# Patient Record
Sex: Female | Born: 1945 | Race: Black or African American | Hispanic: No | Marital: Married | Smoking: Never smoker
Health system: Southern US, Community
[De-identification: ages and names within clinical notes are randomized; demographics above are authoritative.]

## PROBLEM LIST (undated history)

## (undated) DIAGNOSIS — R7303 Prediabetes: Secondary | ICD-10-CM

## (undated) DIAGNOSIS — I10 Essential (primary) hypertension: Secondary | ICD-10-CM

## (undated) DIAGNOSIS — M1990 Unspecified osteoarthritis, unspecified site: Secondary | ICD-10-CM

## (undated) DIAGNOSIS — Z9889 Other specified postprocedural states: Secondary | ICD-10-CM

## (undated) DIAGNOSIS — E782 Mixed hyperlipidemia: Secondary | ICD-10-CM

## (undated) DIAGNOSIS — R002 Palpitations: Secondary | ICD-10-CM

## (undated) DIAGNOSIS — I341 Nonrheumatic mitral (valve) prolapse: Secondary | ICD-10-CM

## (undated) DIAGNOSIS — N189 Chronic kidney disease, unspecified: Secondary | ICD-10-CM

## (undated) DIAGNOSIS — E119 Type 2 diabetes mellitus without complications: Secondary | ICD-10-CM

## (undated) HISTORY — PX: BREAST EXCISIONAL BIOPSY: SUR124

## (undated) HISTORY — DX: Prediabetes: R73.03

## (undated) HISTORY — DX: Palpitations: R00.2

## (undated) HISTORY — DX: Mixed hyperlipidemia: E78.2

## (undated) HISTORY — DX: Essential (primary) hypertension: I10

## (undated) HISTORY — PX: REPLACEMENT TOTAL KNEE: SUR1224

## (undated) HISTORY — DX: Nonrheumatic mitral (valve) prolapse: I34.1

## (undated) HISTORY — PX: CATARACT EXTRACTION PHACO AND INTRAOCULAR LENS PLACEMENT W/ CORTICOSTEROID: SHX7020

---

## 2004-06-07 ENCOUNTER — Ambulatory Visit: Payer: Self-pay | Admitting: Occupational Therapy

## 2005-06-30 ENCOUNTER — Emergency Department (HOSPITAL_COMMUNITY): Admission: EM | Admit: 2005-06-30 | Discharge: 2005-06-30 | Payer: Self-pay | Admitting: Emergency Medicine

## 2007-03-26 ENCOUNTER — Encounter: Admission: RE | Admit: 2007-03-26 | Discharge: 2007-03-26 | Payer: Self-pay | Admitting: Nurse Practitioner

## 2007-04-16 ENCOUNTER — Ambulatory Visit (HOSPITAL_COMMUNITY): Admission: RE | Admit: 2007-04-16 | Discharge: 2007-04-16 | Payer: Self-pay | Admitting: Internal Medicine

## 2007-04-16 ENCOUNTER — Ambulatory Visit: Payer: Self-pay | Admitting: Internal Medicine

## 2007-04-16 ENCOUNTER — Encounter: Payer: Self-pay | Admitting: Internal Medicine

## 2008-02-19 ENCOUNTER — Ambulatory Visit: Payer: Self-pay | Admitting: Internal Medicine

## 2008-02-19 DIAGNOSIS — E119 Type 2 diabetes mellitus without complications: Secondary | ICD-10-CM

## 2008-02-19 DIAGNOSIS — I10 Essential (primary) hypertension: Secondary | ICD-10-CM | POA: Insufficient documentation

## 2008-02-19 DIAGNOSIS — E785 Hyperlipidemia, unspecified: Secondary | ICD-10-CM | POA: Insufficient documentation

## 2008-02-19 DIAGNOSIS — J309 Allergic rhinitis, unspecified: Secondary | ICD-10-CM | POA: Insufficient documentation

## 2008-04-08 ENCOUNTER — Encounter (INDEPENDENT_AMBULATORY_CARE_PROVIDER_SITE_OTHER): Payer: Self-pay | Admitting: Internal Medicine

## 2008-04-24 ENCOUNTER — Telehealth (INDEPENDENT_AMBULATORY_CARE_PROVIDER_SITE_OTHER): Payer: Self-pay | Admitting: Internal Medicine

## 2008-05-19 ENCOUNTER — Ambulatory Visit: Payer: Self-pay | Admitting: Internal Medicine

## 2008-05-19 DIAGNOSIS — R42 Dizziness and giddiness: Secondary | ICD-10-CM

## 2008-05-19 DIAGNOSIS — M549 Dorsalgia, unspecified: Secondary | ICD-10-CM | POA: Insufficient documentation

## 2008-05-19 LAB — CONVERTED CEMR LAB: Blood Glucose, Fingerstick: 78

## 2008-05-23 ENCOUNTER — Encounter: Admission: RE | Admit: 2008-05-23 | Discharge: 2008-05-23 | Payer: Self-pay | Admitting: Internal Medicine

## 2008-06-10 ENCOUNTER — Encounter (INDEPENDENT_AMBULATORY_CARE_PROVIDER_SITE_OTHER): Payer: Self-pay | Admitting: Internal Medicine

## 2008-06-11 LAB — CONVERTED CEMR LAB
Alkaline Phosphatase: 56 units/L (ref 39–117)
BUN: 23 mg/dL (ref 6–23)
Calcium: 9.5 mg/dL (ref 8.4–10.5)
Cholesterol: 172 mg/dL (ref 0–200)
Creatinine, Ser: 1.19 mg/dL (ref 0.40–1.20)
Potassium: 4 meq/L (ref 3.5–5.3)
Total CHOL/HDL Ratio: 2.5

## 2008-08-08 ENCOUNTER — Ambulatory Visit: Payer: Self-pay | Admitting: Internal Medicine

## 2008-08-08 DIAGNOSIS — N390 Urinary tract infection, site not specified: Secondary | ICD-10-CM | POA: Insufficient documentation

## 2008-08-08 LAB — CONVERTED CEMR LAB
Bilirubin Urine: NEGATIVE
Ketones, urine, test strip: NEGATIVE
Urobilinogen, UA: 0.2

## 2009-06-02 ENCOUNTER — Encounter: Admission: RE | Admit: 2009-06-02 | Discharge: 2009-06-02 | Payer: Self-pay | Admitting: Family Medicine

## 2010-06-02 ENCOUNTER — Other Ambulatory Visit: Payer: Self-pay | Admitting: Family Medicine

## 2010-06-02 DIAGNOSIS — Z1231 Encounter for screening mammogram for malignant neoplasm of breast: Secondary | ICD-10-CM

## 2010-06-09 ENCOUNTER — Ambulatory Visit: Payer: Self-pay

## 2010-06-29 ENCOUNTER — Ambulatory Visit
Admission: RE | Admit: 2010-06-29 | Discharge: 2010-06-29 | Disposition: A | Discharge status: Home / Self Care | Payer: BLUE CROSS/BLUE SHIELD | Source: Ambulatory Visit | Attending: *Deleted | Admitting: *Deleted

## 2010-06-29 DIAGNOSIS — Z1231 Encounter for screening mammogram for malignant neoplasm of breast: Secondary | ICD-10-CM

## 2010-07-27 NOTE — Op Note (Signed)
Terri Miranda, Terri Miranda              ACCOUNT NO.:  [id number]   MEDICAL RECORD NO.:  [id number]          PATIENT TYPE:  AMB   LOCATION:  DAY                           FACILITY:  [facility]   PHYSICIAN:  R. Roetta Sessions, M.D. DATE OF BIRTH:  [birth year]   DATE OF PROCEDURE:  04/16/2007  DATE OF DISCHARGE:                               OPERATIVE REPORT   PROCEDURE:  Colonoscopy with biopsy.   INDICATIONS FOR PROCEDURE:  A 65 year old lady without any lower GI  tract symptoms, sent over by courtesy of Ninfa Linden, FNP in  [place], [place] for colorectal cancer screening.  She states she had a  colonoscopy some 10 years in [place], and one polyp was found.  There  is no family history of colorectal neoplasia or colonic adenomas.  Colonoscopy is now being done as screening maneuver.  This approach has  discussed with the patient at length.  The potential risks, benefits and  alternatives have been reviewed and questions answered.  She is  agreeable.  Please see the documentation in the medical record.   PROCEDURE NOTE:  O2 saturation, blood pressure, pulse and respiration  were monitored throughout the entire procedure.  Conscious sedation was  with Versed 5 mg IV and Demerol 100 mg IV in divided doses.  Instrument  used was the Pentax video chip system.   FINDINGS:  Digital rectal exam revealed no abnormalities.  The prep was  adequate.   COLON:  The mucosa was surveyed from the rectosigmoid junction through  to the left transverse right colon, then onto the appendiceal orifice,  ileocecal valve and cecum; these structures were well seen and  photographed for the record.  From this level the scope was colon slowly  and cautiously withdrawn.  All previous mentioned mucosal surfaces were  again seen.  The patient had multiple diminutive polyps throughout the  colon.  There were 2 small, adenomatous-appearing polyps in the base of  the cecum -- which were cold biopsied and removed.  In the  mid  descending colon there were a couple more diminutive polyps which were  cold biopsied and removed.  Down in the rectosigmoid and rectum there  were more diminutive polyps, one in each area and no more than 4 mm in  diameter each.  They were all cold biopsied.  The patient also had  scattered sigmoid diverticula.  As the scope was pulled down into the  rectum, retroflexion was performed and this is when the single  diminutive rectal polyp was seen at approximately 6 cm.  As stated, all  of these polyps were cold biopsied and removed.   The patient tolerated the procedure well and was reacted in endoscopy.   IMPRESSION:  Diminutive rectal polyp in the rectosigmoid, descending  colon and cecal polyps -- all cold biopsied and removed.  There was  sigmoid diverticula and colonic mucosa appeared normal.   RECOMMENDATIONS:  1. Follow up on pathology.  2. Further recommendations to follow.      Jonathon Bellows, M.D.  Electronically Signed     RMR/MEDQ  D:  04/16/2007  T:  04/16/2007  Job:  [id number]

## 2011-06-08 ENCOUNTER — Other Ambulatory Visit: Payer: Self-pay | Admitting: Internal Medicine

## 2011-06-08 DIAGNOSIS — Z1231 Encounter for screening mammogram for malignant neoplasm of breast: Secondary | ICD-10-CM

## 2011-07-07 ENCOUNTER — Ambulatory Visit
Admission: RE | Admit: 2011-07-07 | Discharge: 2011-07-07 | Disposition: A | Discharge status: Home / Self Care | Payer: BC Managed Care – PPO | Source: Ambulatory Visit | Attending: Internal Medicine | Admitting: Internal Medicine

## 2011-07-07 DIAGNOSIS — Z1231 Encounter for screening mammogram for malignant neoplasm of breast: Secondary | ICD-10-CM

## 2011-12-14 ENCOUNTER — Encounter (HOSPITAL_COMMUNITY): Payer: Self-pay | Admitting: *Deleted

## 2011-12-14 ENCOUNTER — Emergency Department (HOSPITAL_COMMUNITY)
Admission: EM | Admit: 2011-12-14 | Discharge: 2011-12-14 | Disposition: A | Discharge status: Home / Self Care | Payer: Medicare Other | Attending: Emergency Medicine | Admitting: Emergency Medicine

## 2011-12-14 ENCOUNTER — Emergency Department (HOSPITAL_COMMUNITY): Payer: Medicare Other

## 2011-12-14 DIAGNOSIS — Z888 Allergy status to other drugs, medicaments and biological substances status: Secondary | ICD-10-CM | POA: Insufficient documentation

## 2011-12-14 DIAGNOSIS — Z7982 Long term (current) use of aspirin: Secondary | ICD-10-CM | POA: Insufficient documentation

## 2011-12-14 DIAGNOSIS — I809 Phlebitis and thrombophlebitis of unspecified site: Secondary | ICD-10-CM

## 2011-12-14 DIAGNOSIS — Z79899 Other long term (current) drug therapy: Secondary | ICD-10-CM | POA: Insufficient documentation

## 2011-12-14 DIAGNOSIS — I803 Phlebitis and thrombophlebitis of lower extremities, unspecified: Secondary | ICD-10-CM | POA: Insufficient documentation

## 2011-12-14 DIAGNOSIS — Z91013 Allergy to seafood: Secondary | ICD-10-CM | POA: Insufficient documentation

## 2011-12-14 DIAGNOSIS — I10 Essential (primary) hypertension: Secondary | ICD-10-CM | POA: Insufficient documentation

## 2011-12-14 MED ORDER — IBUPROFEN 600 MG PO TABS: 600.0000 mg | ORAL_TABLET | Freq: Four times a day (QID) | ORAL | Status: DC | PRN

## 2011-12-14 NOTE — ED Notes (Signed)
Pt went to medical clinic today and was sent here for swelling to left lower leg to r/o DVT

## 2011-12-14 NOTE — ED Provider Notes (Signed)
History     CSN: [id number]  Arrival date & time 12/14/11  1610   First MD Initiated Contact with Patient 12/14/11 1652      Chief Complaint  Patient presents with  . Leg Swelling    (Consider location/radiation/quality/duration/timing/severity/associated sxs/prior treatment) HPI Comments: Pt comes in with cc of leg swelling -left sided. She has had increased swelling for a week now, with some pain, described as tightness, and discomfort, mostly in the calf region, worse with walking.  There is no hx of dvt, pe, and she has no risk factors for the same. Pt has no n/v/f/c. No rash. No recent trauma.   The history is provided by the patient.    Past Medical History  Diagnosis Date  . Hypertension     History reviewed. No pertinent past surgical history.  No family history on file.  History  Substance Use Topics  . Smoking status: Never Smoker   . Smokeless tobacco: Not on file  . Alcohol Use: Yes     Occ wine    OB History    Grav Para Term Preterm Abortions TAB SAB Ect Mult Living                  Review of Systems  Constitutional: Positive for activity change.  HENT: Negative for neck pain.   Respiratory: Negative for shortness of breath.   Cardiovascular: Negative for chest pain.  Gastrointestinal: Negative for nausea, vomiting and abdominal pain.  Genitourinary: Negative for dysuria.  Neurological: Negative for headaches.    Allergies  Shellfish allergy and Lovastatin  Home Medications   Current Outpatient Rx  Name Route Sig Dispense Refill  . ASPIRIN EC 81 MG PO TBEC Oral Take 81 mg by mouth daily.    Marland Kitchen CALCIUM CARBONATE 600 MG PO TABS Oral Take 600 mg by mouth daily.    . ENALAPRIL MALEATE 5 MG PO TABS Oral Take 5 mg by mouth daily.    . OMEGA-3 FATTY ACIDS 1000 MG PO CAPS Oral Take 1 g by mouth daily.    Marland Kitchen HYDROCHLOROTHIAZIDE 12.5 MG PO CAPS Oral Take 12.5 mg by mouth daily.    . ADULT MULTIVITAMIN W/MINERALS CH Oral Take 1 tablet by mouth  daily.    Marland Kitchen OXYMETAZOLINE HCL 0.05 % NA SOLN Nasal Place 2 sprays into the nose daily as needed. For congestion/ relief    . PRAVASTATIN SODIUM 20 MG PO TABS Oral Take 20 mg by mouth every evening.      BP 135/69  Pulse 64  Temp 98.3 F (36.8 C) (Oral)  Resp 18  Ht 5' 7.5" (1.715 m)  Wt 207 lb (93.895 kg)  BMI 31.94 kg/m2  SpO2 99%  Physical Exam  Nursing note and vitals reviewed. Constitutional: She is oriented to person, place, and time. She appears well-developed and well-nourished.  HENT:  Head: Normocephalic and atraumatic.  Eyes: EOM are normal. Pupils are equal, round, and reactive to light.  Neck: Neck supple.  Cardiovascular: Normal rate, regular rhythm and normal heart sounds.   No murmur heard. Pulmonary/Chest: Effort normal. No respiratory distress.  Abdominal: Soft. She exhibits no distension. There is no tenderness. There is no rebound and no guarding.  Musculoskeletal:       LLE swelling - about 4 cm compared to the contralateral side. There is calf tenderness with palpation. Pt has some warmth to touch, but no true erythema noticed.  Neurological: She is alert and oriented to person, place, and time.  Skin: Skin is warm and dry.    ED Course  Procedures (including critical care time)  Labs Reviewed - No data to display US Venous Img Lower Unilateral Left  12/14/2011  *RADIOLOGY REPORT*  Clinical Data: 66 year old female with left lower extremity pain and swelling.  LEFT LOWER EXTREMITY VENOUS DUPLEX ULTRASOUND  Technique:  Gray-scale sonography with graded compression, as well as color Doppler and duplex ultrasound were performed to evaluate the deep venous system of the lower extremity from the level of the common femoral vein through the popliteal and proximal calf veins. Spectral Doppler was utilized to evaluate flow at rest and with distal augmentation maneuvers.  Comparison:  None.  Findings:  Normal compressibility of the common femoral, superficial  femoral, and popliteal veins is demonstrated, as well as the visualized proximal calf veins.  No filling defects to suggest DVT on grayscale or color Doppler imaging.  Doppler waveforms show normal direction of venous flow, normal respiratory phasicity and response to augmentation.  Thrombosis of a superficial vein in the posterior calf is identified.  IMPRESSION: No evidence of left lower extremity deep vein thrombosis.  Superficial venous thrombosis in the posterior calf.   Original Report Authenticated By: Rosendo Gros, M.D.      No diagnosis found.    MDM  DDx: DVT Cellulitis Osteomyelitis Superficial thrombophlebitis  Pt comes in with unilateral leg swelling, and my exam demonstrated 4 cm widening of the LLE, with calf tenderness and some warmth to touch. Concerns for DVT. Will get [place].  6:57 PM US duplex shows superficial thrombophlebitis. Will treat with nsaids, ice.        Derwood Kaplan, MD 12/14/11 1857

## 2011-12-14 NOTE — ED Notes (Signed)
Pt states she is not having any pain.

## 2011-12-14 NOTE — ED Notes (Signed)
Discharge instructions reviewed with pt, questions answered. Pt verbalized understanding.  

## 2011-12-14 NOTE — ED Notes (Signed)
PT to us

## 2012-02-21 ENCOUNTER — Other Ambulatory Visit: Payer: Self-pay | Admitting: Internal Medicine

## 2012-02-21 ENCOUNTER — Other Ambulatory Visit (HOSPITAL_COMMUNITY)
Admission: RE | Admit: 2012-02-21 | Discharge: 2012-02-21 | Disposition: A | Discharge status: Home / Self Care | Payer: Medicare Other | Source: Ambulatory Visit | Attending: Internal Medicine | Admitting: Internal Medicine

## 2012-02-21 DIAGNOSIS — Z1151 Encounter for screening for human papillomavirus (HPV): Secondary | ICD-10-CM | POA: Insufficient documentation

## 2012-02-21 DIAGNOSIS — Z01419 Encounter for gynecological examination (general) (routine) without abnormal findings: Secondary | ICD-10-CM | POA: Insufficient documentation

## 2012-06-12 DIAGNOSIS — E119 Type 2 diabetes mellitus without complications: Secondary | ICD-10-CM | POA: Diagnosis not present

## 2012-06-12 DIAGNOSIS — E785 Hyperlipidemia, unspecified: Secondary | ICD-10-CM | POA: Diagnosis not present

## 2012-06-12 DIAGNOSIS — K1120 Sialoadenitis, unspecified: Secondary | ICD-10-CM | POA: Diagnosis not present

## 2012-06-12 DIAGNOSIS — I10 Essential (primary) hypertension: Secondary | ICD-10-CM | POA: Diagnosis not present

## 2012-07-03 DIAGNOSIS — K1120 Sialoadenitis, unspecified: Secondary | ICD-10-CM | POA: Diagnosis not present

## 2012-07-23 ENCOUNTER — Other Ambulatory Visit: Payer: Self-pay

## 2012-07-23 DIAGNOSIS — Z1231 Encounter for screening mammogram for malignant neoplasm of breast: Secondary | ICD-10-CM

## 2012-08-16 ENCOUNTER — Ambulatory Visit
Admission: RE | Admit: 2012-08-16 | Discharge: 2012-08-16 | Disposition: A | Discharge status: Home / Self Care | Payer: Medicare Other | Source: Ambulatory Visit

## 2012-08-16 DIAGNOSIS — Z1231 Encounter for screening mammogram for malignant neoplasm of breast: Secondary | ICD-10-CM

## 2012-08-20 DIAGNOSIS — E119 Type 2 diabetes mellitus without complications: Secondary | ICD-10-CM | POA: Diagnosis not present

## 2012-08-20 DIAGNOSIS — H40029 Open angle with borderline findings, high risk, unspecified eye: Secondary | ICD-10-CM | POA: Diagnosis not present

## 2012-08-20 DIAGNOSIS — H04129 Dry eye syndrome of unspecified lacrimal gland: Secondary | ICD-10-CM | POA: Diagnosis not present

## 2012-08-20 DIAGNOSIS — H00029 Hordeolum internum unspecified eye, unspecified eyelid: Secondary | ICD-10-CM | POA: Diagnosis not present

## 2012-09-05 DIAGNOSIS — E119 Type 2 diabetes mellitus without complications: Secondary | ICD-10-CM | POA: Diagnosis not present

## 2012-09-05 DIAGNOSIS — I10 Essential (primary) hypertension: Secondary | ICD-10-CM | POA: Diagnosis not present

## 2012-09-05 DIAGNOSIS — M7989 Other specified soft tissue disorders: Secondary | ICD-10-CM | POA: Diagnosis not present

## 2012-09-05 DIAGNOSIS — E785 Hyperlipidemia, unspecified: Secondary | ICD-10-CM | POA: Diagnosis not present

## 2012-09-05 DIAGNOSIS — R300 Dysuria: Secondary | ICD-10-CM | POA: Diagnosis not present

## 2012-12-27 DIAGNOSIS — Z23 Encounter for immunization: Secondary | ICD-10-CM | POA: Diagnosis not present

## 2013-02-21 DIAGNOSIS — Z0000 Encounter for general adult medical examination without abnormal findings: Secondary | ICD-10-CM | POA: Diagnosis not present

## 2013-02-21 DIAGNOSIS — I10 Essential (primary) hypertension: Secondary | ICD-10-CM | POA: Diagnosis not present

## 2013-02-21 DIAGNOSIS — E785 Hyperlipidemia, unspecified: Secondary | ICD-10-CM | POA: Diagnosis not present

## 2013-02-21 DIAGNOSIS — R609 Edema, unspecified: Secondary | ICD-10-CM | POA: Diagnosis not present

## 2013-02-21 DIAGNOSIS — Z23 Encounter for immunization: Secondary | ICD-10-CM | POA: Diagnosis not present

## 2013-02-21 DIAGNOSIS — R0609 Other forms of dyspnea: Secondary | ICD-10-CM | POA: Diagnosis not present

## 2013-02-21 DIAGNOSIS — E119 Type 2 diabetes mellitus without complications: Secondary | ICD-10-CM | POA: Diagnosis not present

## 2013-02-21 DIAGNOSIS — N189 Chronic kidney disease, unspecified: Secondary | ICD-10-CM | POA: Diagnosis not present

## 2013-05-15 DIAGNOSIS — E785 Hyperlipidemia, unspecified: Secondary | ICD-10-CM | POA: Diagnosis not present

## 2013-05-15 DIAGNOSIS — E119 Type 2 diabetes mellitus without complications: Secondary | ICD-10-CM | POA: Diagnosis not present

## 2013-08-19 ENCOUNTER — Other Ambulatory Visit: Payer: Self-pay

## 2013-08-19 DIAGNOSIS — Z1231 Encounter for screening mammogram for malignant neoplasm of breast: Secondary | ICD-10-CM

## 2013-08-26 ENCOUNTER — Encounter (INDEPENDENT_AMBULATORY_CARE_PROVIDER_SITE_OTHER): Payer: Self-pay

## 2013-08-26 ENCOUNTER — Ambulatory Visit
Admission: RE | Admit: 2013-08-26 | Discharge: 2013-08-26 | Disposition: A | Discharge status: Home / Self Care | Payer: Medicare Other | Source: Ambulatory Visit

## 2013-08-26 DIAGNOSIS — Z1231 Encounter for screening mammogram for malignant neoplasm of breast: Secondary | ICD-10-CM

## 2013-08-26 DIAGNOSIS — I10 Essential (primary) hypertension: Secondary | ICD-10-CM | POA: Diagnosis not present

## 2013-08-26 DIAGNOSIS — E785 Hyperlipidemia, unspecified: Secondary | ICD-10-CM | POA: Diagnosis not present

## 2013-08-26 DIAGNOSIS — E119 Type 2 diabetes mellitus without complications: Secondary | ICD-10-CM | POA: Diagnosis not present

## 2013-08-26 DIAGNOSIS — R29898 Other symptoms and signs involving the musculoskeletal system: Secondary | ICD-10-CM | POA: Diagnosis not present

## 2013-08-26 DIAGNOSIS — Z1211 Encounter for screening for malignant neoplasm of colon: Secondary | ICD-10-CM | POA: Diagnosis not present

## 2013-09-17 ENCOUNTER — Ambulatory Visit: Payer: PRIVATE HEALTH INSURANCE | Admitting: Podiatry

## 2013-10-07 DIAGNOSIS — R300 Dysuria: Secondary | ICD-10-CM | POA: Diagnosis not present

## 2013-10-07 DIAGNOSIS — N390 Urinary tract infection, site not specified: Secondary | ICD-10-CM | POA: Diagnosis not present

## 2013-11-07 ENCOUNTER — Encounter (INDEPENDENT_AMBULATORY_CARE_PROVIDER_SITE_OTHER): Payer: Self-pay | Admitting: *Deleted

## 2013-11-19 ENCOUNTER — Encounter (INDEPENDENT_AMBULATORY_CARE_PROVIDER_SITE_OTHER): Payer: Self-pay | Admitting: *Deleted

## 2013-11-19 DIAGNOSIS — E119 Type 2 diabetes mellitus without complications: Secondary | ICD-10-CM | POA: Diagnosis not present

## 2013-11-19 DIAGNOSIS — H00029 Hordeolum internum unspecified eye, unspecified eyelid: Secondary | ICD-10-CM | POA: Diagnosis not present

## 2013-11-19 DIAGNOSIS — H40029 Open angle with borderline findings, high risk, unspecified eye: Secondary | ICD-10-CM | POA: Diagnosis not present

## 2013-11-21 ENCOUNTER — Ambulatory Visit (INDEPENDENT_AMBULATORY_CARE_PROVIDER_SITE_OTHER): Payer: Medicare Other | Admitting: Podiatry

## 2013-11-21 ENCOUNTER — Encounter: Payer: Self-pay | Admitting: Podiatry

## 2013-11-21 VITALS — BP 128/72 | HR 70 | Resp 16 | Ht 67.0 in | Wt 205.0 lb

## 2013-11-21 DIAGNOSIS — L608 Other nail disorders: Secondary | ICD-10-CM | POA: Diagnosis not present

## 2013-11-21 DIAGNOSIS — B351 Tinea unguium: Secondary | ICD-10-CM | POA: Diagnosis not present

## 2013-11-21 NOTE — Progress Notes (Signed)
   Subjective:    Patient ID: Terri Miranda, female    DOB: [birth year], 68 y.o.   MRN: [id number]  HPI Comments: Toenails are discolored and spreading , it has been going on for years. Tried to use tea tree oil on it .      Review of Systems  Skin:       Change in nails   Hematological: Bruises/bleeds easily.  All other systems reviewed and are negative.      Objective:   Physical Exam: I have reviewed her past medical history medications allergies surgeries social history and review of systems. Pulses are strongly palpable bilateral. Neurologic sensorium is intact per Semmes-Weinstein monofilament. Deep tendon reflexes are intact bilateral muscle strength is 5 over 5 dorsiflexors plantar flexors inverters everters all intrinsic musculature is intact. Orthopedic evaluation Mr. is all joints distal to the ankle a full range of motion without crepitation. She has hammertoe deformities bilateral flexible in nature. Cutaneous evaluation demonstrates supple well hydrated cutis no erythema edema cellulitis drainage or odor. Nail plates of the hallux bilateral and toenails from toes #3 and #4 of the left foot demonstrate discoloration thickening. I do believe the discoloration is simply Melanonychia. However female dystrophy cannot be ruled out nor can nail fungus.        Assessment & Plan:  Assessment: Hammertoe deformities nail dystrophy possible onychomycosis.  Plan: Discussed etiology pathology conservative versus surgical therapies. We took samples of the skin and of the nail plates today to send for pathology once his return we will get back in touch with her for therapy. She does not want an oral therapy. He topical will be prescribed and this patient does not have to come in for this and unless other findings necessitate this.

## 2013-12-17 ENCOUNTER — Telehealth: Payer: Self-pay | Admitting: *Deleted

## 2013-12-17 NOTE — Telephone Encounter (Signed)
I was in I believe in September.  I have a question.  Thank you.

## 2013-12-19 MED ORDER — NUVAIL EX SOLN: 1.0000 "application " | Freq: Every day | CUTANEOUS | Status: DC

## 2013-12-19 NOTE — Telephone Encounter (Signed)
I returned her call.  She stated, "I had a nail sample sent to the lab and I haven't received the results."  I told her we just got the results and it is negative for fungus.  Dr. Milinda Pointer said he can prescribe Nuvail to help thin the nail out if you like.  She stated, :That is fine, I will try it."  Prescription was sent to [facility] in [place].

## 2013-12-20 ENCOUNTER — Encounter: Payer: Self-pay | Admitting: Podiatry

## 2013-12-26 DIAGNOSIS — R300 Dysuria: Secondary | ICD-10-CM | POA: Diagnosis not present

## 2013-12-26 DIAGNOSIS — Z23 Encounter for immunization: Secondary | ICD-10-CM | POA: Diagnosis not present

## 2013-12-26 DIAGNOSIS — I10 Essential (primary) hypertension: Secondary | ICD-10-CM | POA: Diagnosis not present

## 2014-02-24 DIAGNOSIS — Z0000 Encounter for general adult medical examination without abnormal findings: Secondary | ICD-10-CM | POA: Diagnosis not present

## 2014-02-24 DIAGNOSIS — E119 Type 2 diabetes mellitus without complications: Secondary | ICD-10-CM | POA: Diagnosis not present

## 2014-02-24 DIAGNOSIS — J069 Acute upper respiratory infection, unspecified: Secondary | ICD-10-CM | POA: Diagnosis not present

## 2014-02-24 DIAGNOSIS — M8580 Other specified disorders of bone density and structure, unspecified site: Secondary | ICD-10-CM | POA: Diagnosis not present

## 2014-02-24 DIAGNOSIS — Z23 Encounter for immunization: Secondary | ICD-10-CM | POA: Diagnosis not present

## 2014-02-24 DIAGNOSIS — I10 Essential (primary) hypertension: Secondary | ICD-10-CM | POA: Diagnosis not present

## 2014-02-24 DIAGNOSIS — Z1389 Encounter for screening for other disorder: Secondary | ICD-10-CM | POA: Diagnosis not present

## 2014-03-13 DIAGNOSIS — M8580 Other specified disorders of bone density and structure, unspecified site: Secondary | ICD-10-CM | POA: Diagnosis not present

## 2014-03-17 DIAGNOSIS — K115 Sialolithiasis: Secondary | ICD-10-CM | POA: Diagnosis not present

## 2014-03-17 DIAGNOSIS — K111 Hypertrophy of salivary gland: Secondary | ICD-10-CM | POA: Diagnosis not present

## 2014-03-28 DIAGNOSIS — K115 Sialolithiasis: Secondary | ICD-10-CM | POA: Diagnosis not present

## 2014-03-28 DIAGNOSIS — K1120 Sialoadenitis, unspecified: Secondary | ICD-10-CM | POA: Diagnosis not present

## 2014-04-02 ENCOUNTER — Other Ambulatory Visit: Payer: Self-pay | Admitting: Otolaryngology

## 2014-04-02 DIAGNOSIS — K115 Sialolithiasis: Secondary | ICD-10-CM

## 2014-04-02 DIAGNOSIS — K1120 Sialoadenitis, unspecified: Secondary | ICD-10-CM

## 2014-04-09 ENCOUNTER — Ambulatory Visit
Admission: RE | Admit: 2014-04-09 | Discharge: 2014-04-09 | Disposition: A | Discharge status: Home / Self Care | Payer: Medicare Other | Source: Ambulatory Visit | Attending: Otolaryngology | Admitting: Otolaryngology

## 2014-04-09 DIAGNOSIS — K1120 Sialoadenitis, unspecified: Secondary | ICD-10-CM

## 2014-04-09 DIAGNOSIS — K115 Sialolithiasis: Secondary | ICD-10-CM

## 2014-04-09 MED ORDER — IOHEXOL 300 MG/ML  SOLN
75.0000 mL | Freq: Once | INTRAMUSCULAR | Status: AC | PRN
  Administered 2014-04-09: 75 mL via INTRAVENOUS

## 2014-07-30 ENCOUNTER — Other Ambulatory Visit: Payer: Self-pay

## 2014-07-30 DIAGNOSIS — Z1231 Encounter for screening mammogram for malignant neoplasm of breast: Secondary | ICD-10-CM

## 2014-08-20 DIAGNOSIS — K115 Sialolithiasis: Secondary | ICD-10-CM | POA: Diagnosis not present

## 2014-08-25 DIAGNOSIS — E1122 Type 2 diabetes mellitus with diabetic chronic kidney disease: Secondary | ICD-10-CM | POA: Diagnosis not present

## 2014-08-25 DIAGNOSIS — N183 Chronic kidney disease, stage 3 (moderate): Secondary | ICD-10-CM | POA: Diagnosis not present

## 2014-08-25 DIAGNOSIS — R600 Localized edema: Secondary | ICD-10-CM | POA: Diagnosis not present

## 2014-08-25 DIAGNOSIS — I10 Essential (primary) hypertension: Secondary | ICD-10-CM | POA: Diagnosis not present

## 2014-08-25 DIAGNOSIS — E785 Hyperlipidemia, unspecified: Secondary | ICD-10-CM | POA: Diagnosis not present

## 2014-08-28 ENCOUNTER — Ambulatory Visit
Admission: RE | Admit: 2014-08-28 | Discharge: 2014-08-28 | Disposition: A | Discharge status: Home / Self Care | Payer: Medicare Other | Source: Ambulatory Visit

## 2014-08-28 DIAGNOSIS — Z1231 Encounter for screening mammogram for malignant neoplasm of breast: Secondary | ICD-10-CM | POA: Diagnosis not present

## 2014-11-24 DIAGNOSIS — H40023 Open angle with borderline findings, high risk, bilateral: Secondary | ICD-10-CM | POA: Diagnosis not present

## 2014-11-24 DIAGNOSIS — E119 Type 2 diabetes mellitus without complications: Secondary | ICD-10-CM | POA: Diagnosis not present

## 2014-11-24 DIAGNOSIS — H04123 Dry eye syndrome of bilateral lacrimal glands: Secondary | ICD-10-CM | POA: Diagnosis not present

## 2014-11-24 DIAGNOSIS — H00021 Hordeolum internum right upper eyelid: Secondary | ICD-10-CM | POA: Diagnosis not present

## 2014-12-22 DIAGNOSIS — Z23 Encounter for immunization: Secondary | ICD-10-CM | POA: Diagnosis not present

## 2015-01-02 DIAGNOSIS — R002 Palpitations: Secondary | ICD-10-CM | POA: Diagnosis not present

## 2015-01-02 DIAGNOSIS — E785 Hyperlipidemia, unspecified: Secondary | ICD-10-CM | POA: Diagnosis not present

## 2015-01-02 DIAGNOSIS — I10 Essential (primary) hypertension: Secondary | ICD-10-CM | POA: Diagnosis not present

## 2015-01-02 DIAGNOSIS — E1122 Type 2 diabetes mellitus with diabetic chronic kidney disease: Secondary | ICD-10-CM | POA: Diagnosis not present

## 2015-01-02 DIAGNOSIS — N183 Chronic kidney disease, stage 3 (moderate): Secondary | ICD-10-CM | POA: Diagnosis not present

## 2015-01-07 ENCOUNTER — Encounter: Payer: Self-pay | Admitting: Cardiology

## 2015-01-07 ENCOUNTER — Ambulatory Visit: Payer: Medicare Other | Admitting: Cardiology

## 2015-01-07 ENCOUNTER — Ambulatory Visit (INDEPENDENT_AMBULATORY_CARE_PROVIDER_SITE_OTHER): Payer: Medicare Other | Admitting: Cardiology

## 2015-01-07 VITALS — BP 132/84 | HR 79 | Ht 67.0 in | Wt 207.3 lb

## 2015-01-07 DIAGNOSIS — E782 Mixed hyperlipidemia: Secondary | ICD-10-CM | POA: Diagnosis not present

## 2015-01-07 DIAGNOSIS — I10 Essential (primary) hypertension: Secondary | ICD-10-CM | POA: Diagnosis not present

## 2015-01-07 DIAGNOSIS — I341 Nonrheumatic mitral (valve) prolapse: Secondary | ICD-10-CM | POA: Diagnosis not present

## 2015-01-07 DIAGNOSIS — R7303 Prediabetes: Secondary | ICD-10-CM

## 2015-01-07 DIAGNOSIS — R002 Palpitations: Secondary | ICD-10-CM

## 2015-01-07 NOTE — Progress Notes (Signed)
Cardiology Office Note  Date: 01/07/2015   ID: MAILYN STEICHEN, DOB 1945-04-22, MRN [id number]  PCP: Kelton Pillar Herschell Dimes, MD  Consulting Cardiologist: Rozann Lesches, MD   Chief Complaint  Patient presents with  . Palpitations    History of Present Illness: Terri Miranda is a 69 y.o. female referred for cardiology consultation by Dr. Kelton Pillar. I reviewed her records. She reports a history of mitral valve prolapse diagnosed years ago. I do not have any old echocardiogram reports for review. She is referred today to review a recent ECG was that was obtained in the setting of reported palpitations. She tells me that she has had palpitations in fact for quite a long time, intermittent over several months to years. She has never had sudden unexplained syncope with these symptoms, nor does she have exertional chest pain or unusual breathlessness. She states that symptoms are brief, feels somewhat like a "hot flash." She reports warmth in her neck, a vague feeling of fast heartbeat.  Recent ECG from 12/03/2014 showed sinus rhythm with nonspecific T-wave inversion in the inferior and anterolateral leads. There are no old tracings for comparison.  We reviewed medications which are outlined below. She has had some adjustments in her antihypertensive regimen, but otherwise has been on a stable course. She reports prediabetes, but has not required medical therapy. She also takes Pravachol for management of her lipids. Most recent lab work is outlined below.  Today we discussed follow-up evaluation for her mitral valve prolapse, and also the potential for further outpatient cardiac monitoring to better understand the etiology of her palpitations. She explains that she plans to go out of town, visiting in [place], [place] for the next 3 weeks, and would like to defer testing until she returns.   Past Medical History  Diagnosis Date  . Essential hypertension   . Pre-diabetes   .  Mixed hyperlipidemia   . Mitral valve prolapse   . Palpitations     History reviewed. No pertinent past surgical history.  Current Outpatient Prescriptions  Medication Sig Dispense Refill  . aspirin EC 81 MG tablet Take 81 mg by mouth daily.    . calcium carbonate (OS-CAL) 600 MG TABS Take 600 mg by mouth daily.    . enalapril (VASOTEC) 5 MG tablet Take 5 mg by mouth daily.    . fish oil-omega-3 fatty acids 1000 MG capsule Take 1 g by mouth daily.    . hydrochlorothiazide (MICROZIDE) 12.5 MG capsule Take 12.5 mg by mouth daily.    Marland Kitchen ibuprofen (ADVIL,MOTRIN) 600 MG tablet Take 1 tablet (600 mg total) by mouth every 6 (six) hours as needed for pain. 30 tablet 0  . oxymetazoline (NASAL SPRAY 12 HOUR) 0.05 % nasal spray Place 2 sprays into the nose daily as needed. For congestion/ relief    . pravastatin (PRAVACHOL) 20 MG tablet Take 20 mg by mouth every evening.    . Vitamin D, Ergocalciferol, (DRISDOL) 50000 UNITS CAPS capsule Take 50,000 Units by mouth every 7 (seven) days.    . Multiple Vitamin (MULTIVITAMIN WITH MINERALS) TABS Take 1 tablet by mouth daily.     No current facility-administered medications for this visit.    Allergies:  Shellfish allergy and Lovastatin   Social History: The patient  reports that she has never smoked. She has never used smokeless tobacco. She reports that she drinks alcohol.   Family History: The patient's family history includes CVA in her mother; Coronary artery disease in her father  and sister; Hypertension in her mother and sister; Prostate cancer in her father.   ROS:  Please see the history of present illness. Otherwise, complete review of systems is positive for none.  All other systems are reviewed and negative.   Physical Exam: VS:  BP 132/84 mmHg  Pulse 79  Ht 5\' 7"  (1.702 m)  Wt 207 lb 4.8 oz (94.031 kg)  BMI 32.46 kg/m2  SpO2 99%, BMI Body mass index is 32.46 kg/(m^2).  Wt Readings from Last 3 Encounters:  01/07/15 207 lb 4.8 oz  (94.031 kg)  11/21/13 205 lb (92.987 kg)  12/14/11 207 lb (93.895 kg)     General: Patient appears comfortable at rest. HEENT: Conjunctiva and lids normal, oropharynx clear. Neck: Supple, no elevated JVP or carotid bruits, no thyromegaly. Lungs: Clear to auscultation, nonlabored breathing at rest. Cardiac: Regular rate and rhythm, no S3, midsystolic click with late systolic murmur heard intermittently, no pericardial rub. Abdomen: Soft, nontender, bowel sounds present, no guarding or rebound. Extremities: No pitting edema, distal pulses 2+. Skin: Warm and dry. Musculoskeletal: No kyphosis. Neuropsychiatric: Alert and oriented x3, affect grossly appropriate.   ECG: ECG is not ordered today.  Recent Labwork:  June 2016 hemoglobin A1c 6.2, BUN 18, creatinine 1.1, potassium 4.12 February 2014: Hemoglobin 12.6, platelets 160, cholesterol 167, triglycerides 74, HDL 108, LDL 94, AST 13, ALT 9, TSH 0.83  ASSESSMENT AND PLAN:  1. Reported history of long-standing but intermittent palpitations as outlined above. She has had no chest pain or syncope with symptoms. There does not appear to be any specific precipitant that she can identify. Frequency ranges widely from once a week to once a month. Her ECG is reviewed today, she has nonspecific T-wave abnormalities. I do not have an old tracing for comparison. History includes long-standing hypertension and also mitral valve prolapse which could be contributors. I recommended that she consider further outpatient cardiac monitoring to more objectively evaluated her palpitations. She would like to defer testing until she returns from a trip to [place] in 3 weeks. I did ask her to seek medical attention if her symptoms suddenly escalate or become associated with more concerning features such as syncope or chest pain.  2. Reported history of mitral valve prolapse. She does have a heart murmur consistent with this. I have recommended an echocardiogram  for further evaluation. She would like to defer this until she returns from a trip to [place] in 3 weeks.  3. Essential hypertension, managed with Vasotec and HCTZ.  4. Reported prediabetes, not requiring any specific medical treatment at this time.  5. Hyperlipidemia, on Pravachol, last LDL was 94.  Current medicines were reviewed at length with the patient today.   Orders Placed This Encounter  Procedures  . Echocardiogram    Disposition: FU with me in 1 month.   Signed, Satira Sark, MD, [facility] 01/07/2015 4:59 PM    [facility] at [facility] [address], [place], [place] [address] Phone: [phone]; Fax: [phone]

## 2015-01-07 NOTE — Patient Instructions (Signed)
Your physician recommends that you schedule a follow-up appointment in: 1  Month with Dr.McDowell  Your physician recommends that you continue on your current medications as directed. Please refer to the Current Medication list given to you today.  If you need a refill on your cardiac medications before your next appointment, please call your pharmacy.  Your physician has requested that you have an echocardiogram. Echocardiography is a painless test that uses sound waves to create images of your heart. It provides your doctor with information about the size and shape of your heart and how well your heart's chambers and valves are working. This procedure takes approximately one hour. There are no restrictions for this procedure.  Thanks for choosing [facility]!!!

## 2015-04-01 ENCOUNTER — Ambulatory Visit: Payer: Medicare Other | Admitting: Cardiology

## 2015-04-27 ENCOUNTER — Ambulatory Visit: Payer: Medicare Other | Admitting: Cardiology

## 2015-04-29 ENCOUNTER — Ambulatory Visit (HOSPITAL_COMMUNITY)
Admission: RE | Admit: 2015-04-29 | Discharge: 2015-04-29 | Disposition: A | Discharge status: Home / Self Care | Payer: Medicare Other | Source: Ambulatory Visit | Attending: Cardiology | Admitting: Cardiology

## 2015-04-29 DIAGNOSIS — I10 Essential (primary) hypertension: Secondary | ICD-10-CM | POA: Insufficient documentation

## 2015-04-29 DIAGNOSIS — E119 Type 2 diabetes mellitus without complications: Secondary | ICD-10-CM | POA: Diagnosis not present

## 2015-04-29 DIAGNOSIS — E785 Hyperlipidemia, unspecified: Secondary | ICD-10-CM | POA: Insufficient documentation

## 2015-04-29 DIAGNOSIS — I341 Nonrheumatic mitral (valve) prolapse: Secondary | ICD-10-CM | POA: Insufficient documentation

## 2015-05-26 ENCOUNTER — Ambulatory Visit: Payer: Medicare Other | Admitting: Cardiology

## 2015-07-27 ENCOUNTER — Other Ambulatory Visit: Payer: Self-pay | Admitting: Nurse Practitioner

## 2015-07-27 ENCOUNTER — Ambulatory Visit
Admission: RE | Admit: 2015-07-27 | Discharge: 2015-07-27 | Disposition: A | Discharge status: Home / Self Care | Payer: Medicare Other | Source: Ambulatory Visit | Attending: Nurse Practitioner | Admitting: Nurse Practitioner

## 2015-07-27 DIAGNOSIS — M25561 Pain in right knee: Secondary | ICD-10-CM | POA: Diagnosis not present

## 2015-07-27 DIAGNOSIS — M170 Bilateral primary osteoarthritis of knee: Secondary | ICD-10-CM | POA: Diagnosis not present

## 2015-07-27 DIAGNOSIS — M25562 Pain in left knee: Secondary | ICD-10-CM | POA: Diagnosis not present

## 2015-07-27 DIAGNOSIS — G8929 Other chronic pain: Secondary | ICD-10-CM | POA: Diagnosis not present

## 2015-07-28 ENCOUNTER — Other Ambulatory Visit: Payer: Self-pay

## 2015-07-28 DIAGNOSIS — Z1231 Encounter for screening mammogram for malignant neoplasm of breast: Secondary | ICD-10-CM

## 2015-08-31 ENCOUNTER — Ambulatory Visit
Admission: RE | Admit: 2015-08-31 | Discharge: 2015-08-31 | Disposition: A | Discharge status: Home / Self Care | Payer: Medicare Other | Source: Ambulatory Visit

## 2015-08-31 DIAGNOSIS — Z1231 Encounter for screening mammogram for malignant neoplasm of breast: Secondary | ICD-10-CM

## 2015-10-14 DIAGNOSIS — M25571 Pain in right ankle and joints of right foot: Secondary | ICD-10-CM | POA: Diagnosis not present

## 2015-10-16 DIAGNOSIS — M25571 Pain in right ankle and joints of right foot: Secondary | ICD-10-CM | POA: Diagnosis not present

## 2015-10-16 DIAGNOSIS — M25579 Pain in unspecified ankle and joints of unspecified foot: Secondary | ICD-10-CM | POA: Insufficient documentation

## 2015-11-26 ENCOUNTER — Other Ambulatory Visit: Payer: Self-pay

## 2015-11-26 ENCOUNTER — Ambulatory Visit
Admission: RE | Admit: 2015-11-26 | Discharge: 2015-11-26 | Disposition: A | Discharge status: Home / Self Care | Payer: Medicare Other | Source: Ambulatory Visit

## 2015-11-26 DIAGNOSIS — Z6834 Body mass index (BMI) 34.0-34.9, adult: Secondary | ICD-10-CM | POA: Diagnosis not present

## 2015-11-26 DIAGNOSIS — M7989 Other specified soft tissue disorders: Secondary | ICD-10-CM | POA: Diagnosis not present

## 2015-11-26 DIAGNOSIS — M1990 Unspecified osteoarthritis, unspecified site: Secondary | ICD-10-CM | POA: Diagnosis not present

## 2015-11-26 DIAGNOSIS — I10 Essential (primary) hypertension: Secondary | ICD-10-CM | POA: Diagnosis not present

## 2015-11-26 DIAGNOSIS — M25471 Effusion, right ankle: Secondary | ICD-10-CM

## 2015-11-26 DIAGNOSIS — E559 Vitamin D deficiency, unspecified: Secondary | ICD-10-CM | POA: Diagnosis not present

## 2015-11-26 DIAGNOSIS — E119 Type 2 diabetes mellitus without complications: Secondary | ICD-10-CM | POA: Diagnosis not present

## 2015-11-26 DIAGNOSIS — E669 Obesity, unspecified: Secondary | ICD-10-CM | POA: Diagnosis not present

## 2015-11-26 DIAGNOSIS — M899 Disorder of bone, unspecified: Secondary | ICD-10-CM | POA: Diagnosis not present

## 2015-11-26 DIAGNOSIS — E785 Hyperlipidemia, unspecified: Secondary | ICD-10-CM | POA: Diagnosis not present

## 2015-11-26 DIAGNOSIS — S82891G Other fracture of right lower leg, subsequent encounter for closed fracture with delayed healing: Secondary | ICD-10-CM | POA: Diagnosis not present

## 2015-12-01 DIAGNOSIS — M170 Bilateral primary osteoarthritis of knee: Secondary | ICD-10-CM | POA: Diagnosis not present

## 2015-12-01 DIAGNOSIS — M659 Synovitis and tenosynovitis, unspecified: Secondary | ICD-10-CM | POA: Diagnosis not present

## 2015-12-03 DIAGNOSIS — M1990 Unspecified osteoarthritis, unspecified site: Secondary | ICD-10-CM | POA: Diagnosis not present

## 2015-12-03 DIAGNOSIS — E119 Type 2 diabetes mellitus without complications: Secondary | ICD-10-CM | POA: Diagnosis not present

## 2015-12-03 DIAGNOSIS — E669 Obesity, unspecified: Secondary | ICD-10-CM | POA: Diagnosis not present

## 2015-12-03 DIAGNOSIS — Z0000 Encounter for general adult medical examination without abnormal findings: Secondary | ICD-10-CM | POA: Diagnosis not present

## 2015-12-03 DIAGNOSIS — Z23 Encounter for immunization: Secondary | ICD-10-CM | POA: Diagnosis not present

## 2015-12-03 DIAGNOSIS — E559 Vitamin D deficiency, unspecified: Secondary | ICD-10-CM | POA: Diagnosis not present

## 2015-12-03 DIAGNOSIS — E785 Hyperlipidemia, unspecified: Secondary | ICD-10-CM | POA: Diagnosis not present

## 2015-12-03 DIAGNOSIS — Z1389 Encounter for screening for other disorder: Secondary | ICD-10-CM | POA: Diagnosis not present

## 2015-12-03 DIAGNOSIS — L84 Corns and callosities: Secondary | ICD-10-CM | POA: Diagnosis not present

## 2015-12-03 DIAGNOSIS — Z6833 Body mass index (BMI) 33.0-33.9, adult: Secondary | ICD-10-CM | POA: Diagnosis not present

## 2015-12-03 DIAGNOSIS — J309 Allergic rhinitis, unspecified: Secondary | ICD-10-CM | POA: Diagnosis not present

## 2015-12-03 DIAGNOSIS — B351 Tinea unguium: Secondary | ICD-10-CM | POA: Diagnosis not present

## 2015-12-16 DIAGNOSIS — H52222 Regular astigmatism, left eye: Secondary | ICD-10-CM | POA: Diagnosis not present

## 2015-12-16 DIAGNOSIS — H04123 Dry eye syndrome of bilateral lacrimal glands: Secondary | ICD-10-CM | POA: Diagnosis not present

## 2015-12-16 DIAGNOSIS — H2513 Age-related nuclear cataract, bilateral: Secondary | ICD-10-CM | POA: Diagnosis not present

## 2015-12-16 DIAGNOSIS — H524 Presbyopia: Secondary | ICD-10-CM | POA: Diagnosis not present

## 2015-12-16 DIAGNOSIS — H5203 Hypermetropia, bilateral: Secondary | ICD-10-CM | POA: Diagnosis not present

## 2015-12-22 ENCOUNTER — Ambulatory Visit: Payer: Medicare Other | Admitting: Podiatry

## 2016-01-05 ENCOUNTER — Other Ambulatory Visit: Payer: Self-pay | Admitting: Internal Medicine

## 2016-01-05 ENCOUNTER — Other Ambulatory Visit (HOSPITAL_COMMUNITY)
Admission: RE | Admit: 2016-01-05 | Discharge: 2016-01-05 | Disposition: A | Discharge status: Home / Self Care | Payer: Medicare Other | Source: Ambulatory Visit | Attending: Internal Medicine | Admitting: Internal Medicine

## 2016-01-05 ENCOUNTER — Ambulatory Visit: Payer: Medicare Other | Admitting: Podiatry

## 2016-01-05 DIAGNOSIS — N952 Postmenopausal atrophic vaginitis: Secondary | ICD-10-CM | POA: Diagnosis not present

## 2016-01-05 DIAGNOSIS — Z01419 Encounter for gynecological examination (general) (routine) without abnormal findings: Secondary | ICD-10-CM | POA: Diagnosis not present

## 2016-01-05 DIAGNOSIS — N907 Vulvar cyst: Secondary | ICD-10-CM | POA: Diagnosis not present

## 2016-01-05 DIAGNOSIS — Z6833 Body mass index (BMI) 33.0-33.9, adult: Secondary | ICD-10-CM | POA: Diagnosis not present

## 2016-01-05 DIAGNOSIS — Z1151 Encounter for screening for human papillomavirus (HPV): Secondary | ICD-10-CM | POA: Diagnosis not present

## 2016-01-05 DIAGNOSIS — R7303 Prediabetes: Secondary | ICD-10-CM | POA: Diagnosis not present

## 2016-01-08 LAB — CYTOLOGY - PAP
Diagnosis: NEGATIVE
HPV: NOT DETECTED

## 2016-03-08 DIAGNOSIS — S61211A Laceration without foreign body of left index finger without damage to nail, initial encounter: Secondary | ICD-10-CM | POA: Diagnosis not present

## 2016-03-11 DIAGNOSIS — S61211D Laceration without foreign body of left index finger without damage to nail, subsequent encounter: Secondary | ICD-10-CM | POA: Diagnosis not present

## 2016-03-23 DIAGNOSIS — S61219D Laceration without foreign body of unspecified finger without damage to nail, subsequent encounter: Secondary | ICD-10-CM | POA: Diagnosis not present

## 2016-05-06 DIAGNOSIS — J019 Acute sinusitis, unspecified: Secondary | ICD-10-CM | POA: Diagnosis not present

## 2016-05-06 DIAGNOSIS — I10 Essential (primary) hypertension: Secondary | ICD-10-CM | POA: Diagnosis not present

## 2016-06-22 DIAGNOSIS — E119 Type 2 diabetes mellitus without complications: Secondary | ICD-10-CM | POA: Diagnosis not present

## 2016-07-05 DIAGNOSIS — J309 Allergic rhinitis, unspecified: Secondary | ICD-10-CM | POA: Diagnosis not present

## 2016-07-05 DIAGNOSIS — G8929 Other chronic pain: Secondary | ICD-10-CM | POA: Diagnosis not present

## 2016-07-05 DIAGNOSIS — I10 Essential (primary) hypertension: Secondary | ICD-10-CM | POA: Diagnosis not present

## 2016-07-05 DIAGNOSIS — E119 Type 2 diabetes mellitus without complications: Secondary | ICD-10-CM | POA: Diagnosis not present

## 2016-07-05 DIAGNOSIS — N183 Chronic kidney disease, stage 3 (moderate): Secondary | ICD-10-CM | POA: Diagnosis not present

## 2016-07-05 DIAGNOSIS — E785 Hyperlipidemia, unspecified: Secondary | ICD-10-CM | POA: Diagnosis not present

## 2016-07-05 DIAGNOSIS — Z6833 Body mass index (BMI) 33.0-33.9, adult: Secondary | ICD-10-CM | POA: Diagnosis not present

## 2016-07-08 DIAGNOSIS — E119 Type 2 diabetes mellitus without complications: Secondary | ICD-10-CM | POA: Diagnosis not present

## 2016-07-15 DIAGNOSIS — E119 Type 2 diabetes mellitus without complications: Secondary | ICD-10-CM | POA: Diagnosis not present

## 2016-07-22 DIAGNOSIS — E119 Type 2 diabetes mellitus without complications: Secondary | ICD-10-CM | POA: Diagnosis not present

## 2016-07-26 ENCOUNTER — Other Ambulatory Visit: Payer: Self-pay | Admitting: Internal Medicine

## 2016-07-26 DIAGNOSIS — Z1231 Encounter for screening mammogram for malignant neoplasm of breast: Secondary | ICD-10-CM

## 2016-07-29 DIAGNOSIS — E119 Type 2 diabetes mellitus without complications: Secondary | ICD-10-CM | POA: Diagnosis not present

## 2016-08-03 DIAGNOSIS — E119 Type 2 diabetes mellitus without complications: Secondary | ICD-10-CM | POA: Diagnosis not present

## 2016-08-05 DIAGNOSIS — E119 Type 2 diabetes mellitus without complications: Secondary | ICD-10-CM | POA: Diagnosis not present

## 2016-09-01 ENCOUNTER — Ambulatory Visit
Admission: RE | Admit: 2016-09-01 | Discharge: 2016-09-01 | Disposition: A | Discharge status: Home / Self Care | Payer: Medicare Other | Source: Ambulatory Visit | Attending: Internal Medicine | Admitting: Internal Medicine

## 2016-09-01 DIAGNOSIS — Z1231 Encounter for screening mammogram for malignant neoplasm of breast: Secondary | ICD-10-CM | POA: Diagnosis not present

## 2016-12-28 DIAGNOSIS — Z23 Encounter for immunization: Secondary | ICD-10-CM | POA: Diagnosis not present

## 2017-03-17 ENCOUNTER — Encounter: Payer: Self-pay | Admitting: Internal Medicine

## 2017-03-22 DIAGNOSIS — E119 Type 2 diabetes mellitus without complications: Secondary | ICD-10-CM | POA: Diagnosis not present

## 2017-03-22 DIAGNOSIS — J309 Allergic rhinitis, unspecified: Secondary | ICD-10-CM | POA: Diagnosis not present

## 2017-03-22 DIAGNOSIS — Z8619 Personal history of other infectious and parasitic diseases: Secondary | ICD-10-CM | POA: Diagnosis not present

## 2017-03-22 DIAGNOSIS — Z0000 Encounter for general adult medical examination without abnormal findings: Secondary | ICD-10-CM | POA: Diagnosis not present

## 2017-03-22 DIAGNOSIS — Z6833 Body mass index (BMI) 33.0-33.9, adult: Secondary | ICD-10-CM | POA: Diagnosis not present

## 2017-03-22 DIAGNOSIS — I10 Essential (primary) hypertension: Secondary | ICD-10-CM | POA: Diagnosis not present

## 2017-05-09 ENCOUNTER — Encounter (INDEPENDENT_AMBULATORY_CARE_PROVIDER_SITE_OTHER): Payer: Self-pay | Admitting: *Deleted

## 2017-05-16 DIAGNOSIS — M1711 Unilateral primary osteoarthritis, right knee: Secondary | ICD-10-CM | POA: Diagnosis not present

## 2017-05-23 ENCOUNTER — Telehealth (INDEPENDENT_AMBULATORY_CARE_PROVIDER_SITE_OTHER): Payer: Self-pay | Admitting: *Deleted

## 2017-05-23 NOTE — Telephone Encounter (Signed)
Patient called and got a letter.  Returned call and let her know that she needed to fill out forms and mail back.

## 2017-05-29 DIAGNOSIS — I10 Essential (primary) hypertension: Secondary | ICD-10-CM | POA: Diagnosis not present

## 2017-05-29 DIAGNOSIS — E119 Type 2 diabetes mellitus without complications: Secondary | ICD-10-CM | POA: Diagnosis not present

## 2017-05-29 DIAGNOSIS — R143 Flatulence: Secondary | ICD-10-CM | POA: Diagnosis not present

## 2017-09-13 DIAGNOSIS — R2240 Localized swelling, mass and lump, unspecified lower limb: Secondary | ICD-10-CM | POA: Diagnosis not present

## 2017-09-13 DIAGNOSIS — S90861A Insect bite (nonvenomous), right foot, initial encounter: Secondary | ICD-10-CM | POA: Diagnosis not present

## 2017-11-22 ENCOUNTER — Other Ambulatory Visit: Payer: Self-pay | Admitting: Internal Medicine

## 2017-11-22 DIAGNOSIS — Z1231 Encounter for screening mammogram for malignant neoplasm of breast: Secondary | ICD-10-CM

## 2017-12-06 DIAGNOSIS — H2513 Age-related nuclear cataract, bilateral: Secondary | ICD-10-CM | POA: Diagnosis not present

## 2017-12-06 DIAGNOSIS — E119 Type 2 diabetes mellitus without complications: Secondary | ICD-10-CM | POA: Diagnosis not present

## 2017-12-06 DIAGNOSIS — H04123 Dry eye syndrome of bilateral lacrimal glands: Secondary | ICD-10-CM | POA: Diagnosis not present

## 2017-12-06 DIAGNOSIS — H35033 Hypertensive retinopathy, bilateral: Secondary | ICD-10-CM | POA: Diagnosis not present

## 2017-12-18 DIAGNOSIS — J019 Acute sinusitis, unspecified: Secondary | ICD-10-CM | POA: Diagnosis not present

## 2017-12-18 DIAGNOSIS — I83893 Varicose veins of bilateral lower extremities with other complications: Secondary | ICD-10-CM | POA: Diagnosis not present

## 2017-12-18 DIAGNOSIS — Z1211 Encounter for screening for malignant neoplasm of colon: Secondary | ICD-10-CM | POA: Diagnosis not present

## 2017-12-18 DIAGNOSIS — M79671 Pain in right foot: Secondary | ICD-10-CM | POA: Diagnosis not present

## 2017-12-18 DIAGNOSIS — Z1389 Encounter for screening for other disorder: Secondary | ICD-10-CM | POA: Diagnosis not present

## 2017-12-18 DIAGNOSIS — E119 Type 2 diabetes mellitus without complications: Secondary | ICD-10-CM | POA: Diagnosis not present

## 2017-12-18 DIAGNOSIS — M1710 Unilateral primary osteoarthritis, unspecified knee: Secondary | ICD-10-CM | POA: Diagnosis not present

## 2017-12-18 DIAGNOSIS — Z0000 Encounter for general adult medical examination without abnormal findings: Secondary | ICD-10-CM | POA: Diagnosis not present

## 2017-12-18 DIAGNOSIS — E785 Hyperlipidemia, unspecified: Secondary | ICD-10-CM | POA: Diagnosis not present

## 2017-12-18 DIAGNOSIS — Z23 Encounter for immunization: Secondary | ICD-10-CM | POA: Diagnosis not present

## 2017-12-18 DIAGNOSIS — I10 Essential (primary) hypertension: Secondary | ICD-10-CM | POA: Diagnosis not present

## 2017-12-18 DIAGNOSIS — W19XXXS Unspecified fall, sequela: Secondary | ICD-10-CM | POA: Diagnosis not present

## 2017-12-25 ENCOUNTER — Ambulatory Visit
Admission: RE | Admit: 2017-12-25 | Discharge: 2017-12-25 | Disposition: A | Discharge status: Home / Self Care | Payer: Medicare Other | Source: Ambulatory Visit | Attending: Internal Medicine | Admitting: Internal Medicine

## 2017-12-25 DIAGNOSIS — Z1231 Encounter for screening mammogram for malignant neoplasm of breast: Secondary | ICD-10-CM | POA: Diagnosis not present

## 2018-01-02 DIAGNOSIS — M1711 Unilateral primary osteoarthritis, right knee: Secondary | ICD-10-CM | POA: Diagnosis not present

## 2018-01-02 DIAGNOSIS — M2141 Flat foot [pes planus] (acquired), right foot: Secondary | ICD-10-CM | POA: Diagnosis not present

## 2018-01-02 DIAGNOSIS — M2142 Flat foot [pes planus] (acquired), left foot: Secondary | ICD-10-CM | POA: Diagnosis not present

## 2018-03-28 DIAGNOSIS — N183 Chronic kidney disease, stage 3 (moderate): Secondary | ICD-10-CM | POA: Diagnosis not present

## 2018-03-28 DIAGNOSIS — R202 Paresthesia of skin: Secondary | ICD-10-CM | POA: Diagnosis not present

## 2018-03-28 DIAGNOSIS — I10 Essential (primary) hypertension: Secondary | ICD-10-CM | POA: Diagnosis not present

## 2018-03-28 DIAGNOSIS — Z1211 Encounter for screening for malignant neoplasm of colon: Secondary | ICD-10-CM | POA: Diagnosis not present

## 2018-03-28 DIAGNOSIS — B351 Tinea unguium: Secondary | ICD-10-CM | POA: Diagnosis not present

## 2018-03-28 DIAGNOSIS — Z23 Encounter for immunization: Secondary | ICD-10-CM | POA: Diagnosis not present

## 2018-03-28 DIAGNOSIS — E1169 Type 2 diabetes mellitus with other specified complication: Secondary | ICD-10-CM | POA: Diagnosis not present

## 2018-04-05 ENCOUNTER — Encounter (HOSPITAL_COMMUNITY): Payer: Medicare Other

## 2018-04-05 ENCOUNTER — Encounter: Payer: Medicare Other | Admitting: Vascular Surgery

## 2018-04-10 ENCOUNTER — Encounter: Payer: Self-pay | Admitting: Sports Medicine

## 2018-04-10 ENCOUNTER — Other Ambulatory Visit: Payer: Self-pay

## 2018-04-10 ENCOUNTER — Ambulatory Visit: Payer: Medicare HMO | Admitting: Sports Medicine

## 2018-04-10 VITALS — BP 130/89 | HR 77

## 2018-04-10 DIAGNOSIS — M79675 Pain in left toe(s): Secondary | ICD-10-CM

## 2018-04-10 DIAGNOSIS — L819 Disorder of pigmentation, unspecified: Secondary | ICD-10-CM | POA: Diagnosis not present

## 2018-04-10 DIAGNOSIS — M779 Enthesopathy, unspecified: Secondary | ICD-10-CM

## 2018-04-10 DIAGNOSIS — B351 Tinea unguium: Secondary | ICD-10-CM | POA: Diagnosis not present

## 2018-04-10 DIAGNOSIS — E119 Type 2 diabetes mellitus without complications: Secondary | ICD-10-CM | POA: Diagnosis not present

## 2018-04-10 DIAGNOSIS — L608 Other nail disorders: Secondary | ICD-10-CM | POA: Diagnosis not present

## 2018-04-10 DIAGNOSIS — E1141 Type 2 diabetes mellitus with diabetic mononeuropathy: Secondary | ICD-10-CM

## 2018-04-10 DIAGNOSIS — M79674 Pain in right toe(s): Secondary | ICD-10-CM | POA: Diagnosis not present

## 2018-04-10 NOTE — Progress Notes (Signed)
Subjective: Terri Miranda is a 73 y.o. female patient seen today in office with complaint of mildly painful thickened and discolored nails. Patient is desiring treatment for nail changes; has tried OTC topicals/Medication and Lamisil in the past with no improvement. Reports that nails are becoming difficult to manage because of the thickness and discoloration however does feel like some of the nails on the right foot is lightening up. Patient also admits that occasionally she has some burning pain to the ball of the right foot and some pain to the right third toe.  Patient states that her right fifth toe she feels a little pain with stretching the toe and a little fullness underneath the toe.  Patient reports that this has been going on for the last few months and is becoming difficult to stay long.  Of time more than 15 minutes due to the pain that she has in the right foot that is more of an aggravation states that she has been using her support hose to help with the swelling of which she is prescribed for her varicose veins however still experiencing pain in the ball of her right foot.  Patient has no other pedal complaints at this time.   Review of Systems  Musculoskeletal: Positive for joint pain.  All other systems reviewed and are negative.  Fasting blood sugar not recorded today but last A1c was 6.1 and last visit to primary care doctor was within the last 3 months.  Patient Active Problem List   Diagnosis Date Noted  . Ankle pain 10/16/2015  . UTI 08/08/2008  . BACK PAIN 05/19/2008  . DIZZINESS 05/19/2008  . DIABETES MELLITUS, TYPE II 02/19/2008  . HYPERLIPIDEMIA 02/19/2008  . HYPERTENSION 02/19/2008  . ALLERGIC RHINITIS 02/19/2008    Current Outpatient Medications on File Prior to Visit  Medication Sig Dispense Refill  . aspirin EC 81 MG tablet Take 81 mg by mouth daily.    . calcium carbonate (OS-CAL) 600 MG TABS Take 600 mg by mouth daily.    . enalapril (VASOTEC) 5 MG tablet  Take 5 mg by mouth daily.    . fish oil-omega-3 fatty acids 1000 MG capsule Take 1 g by mouth daily.    . hydrochlorothiazide (MICROZIDE) 12.5 MG capsule Take 12.5 mg by mouth daily.    Marland Kitchen ibuprofen (ADVIL,MOTRIN) 600 MG tablet Take 1 tablet (600 mg total) by mouth every 6 (six) hours as needed for pain. 30 tablet 0  . Multiple Vitamin (MULTIVITAMIN WITH MINERALS) TABS Take 1 tablet by mouth daily.    Marland Kitchen oxymetazoline (NASAL SPRAY 12 HOUR) 0.05 % nasal spray Place 2 sprays into the nose daily as needed. For congestion/ relief    . pravastatin (PRAVACHOL) 20 MG tablet Take 20 mg by mouth every evening.    . Vitamin D, Ergocalciferol, (DRISDOL) 50000 UNITS CAPS capsule Take 50,000 Units by mouth every 7 (seven) days.     No current facility-administered medications on file prior to visit.     Allergies  Allergen Reactions  . Shellfish Allergy Anaphylaxis  . Lovastatin     REACTION: myalgias    Objective: Physical Exam  General: Well developed, nourished, no acute distress, awake, alert and oriented x 3  Vascular: Dorsalis pedis artery 2/4 bilateral, Posterior tibial artery 1/4 bilateral, skin temperature warm to warm proximal to distal bilateral lower extremities, no varicosities, pedal hair present bilateral.  Trace edema bilateral managed with compression stockings.  Neurological: Gross sensation present via light touch bilateral.  Negative Mulder sign of the right foot.  Subjective burning pain at right third greater than fourth toe.  Dermatological: Skin is warm, dry, and supple bilateral, Nails 1-10 are tender, short thick, and discolored with mild subungal debris with hyperpigmentation noted to multiple nails possible melanonychia, no webspace macerations present bilateral, no open lesions present bilateral, no callus/corns/hyperkeratotic tissue present bilateral. No signs of infection bilateral.  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 Muscular strength within normal limits without painon range of motion to other joints. No pain with calf compression bilateral.  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  Recommend topical pain creams as needed and good supportive shoes with offloading padding to prevent overloading of the lesser metatarsals apply metatarsal padding to the insoles that visit -Fungal culture was obtained by removing a portion of the hard nail itself from each of the involved toenails using a sterile nail nipper and sent to [facility] lab. Patient tolerated the biopsy procedure well without discomfort or need for anesthesia.  -Patient to return in 4 weeks for follow up evaluation and discussion of fungal culture results or sooner if symptoms worsen.  Landis Martins, DPM

## 2018-05-03 ENCOUNTER — Other Ambulatory Visit (INDEPENDENT_AMBULATORY_CARE_PROVIDER_SITE_OTHER): Payer: Self-pay | Admitting: *Deleted

## 2018-05-03 DIAGNOSIS — Z1211 Encounter for screening for malignant neoplasm of colon: Secondary | ICD-10-CM

## 2018-05-08 ENCOUNTER — Encounter: Payer: Self-pay | Admitting: Sports Medicine

## 2018-05-08 ENCOUNTER — Ambulatory Visit: Payer: Medicare HMO | Admitting: Sports Medicine

## 2018-05-08 DIAGNOSIS — B351 Tinea unguium: Secondary | ICD-10-CM

## 2018-05-08 DIAGNOSIS — M779 Enthesopathy, unspecified: Secondary | ICD-10-CM | POA: Diagnosis not present

## 2018-05-08 DIAGNOSIS — E1141 Type 2 diabetes mellitus with diabetic mononeuropathy: Secondary | ICD-10-CM

## 2018-05-08 MED ORDER — TRIAMCINOLONE ACETONIDE 10 MG/ML IJ SUSP
10.0000 mg | Freq: Once | INTRAMUSCULAR | Status: AC
  Administered 2018-05-08: 10 mg

## 2018-05-08 NOTE — Progress Notes (Signed)
Subjective: Terri Miranda is a 73 y.o. female patient seen today in office for fungal culture results and follow up on right 2nd toe joint pain. Patient has no other pedal complaints at this time.   Patient Active Problem List   Diagnosis Date Noted  . Special screening for malignant neoplasms, colon 05/03/2018  . Ankle pain 10/16/2015  . UTI 08/08/2008  . BACK PAIN 05/19/2008  . DIZZINESS 05/19/2008  . DIABETES MELLITUS, TYPE II 02/19/2008  . HYPERLIPIDEMIA 02/19/2008  . HYPERTENSION 02/19/2008  . ALLERGIC RHINITIS 02/19/2008    Current Outpatient Medications on File Prior to Visit  Medication Sig Dispense Refill  . aspirin EC 81 MG tablet Take 81 mg by mouth daily.    . calcium carbonate (OS-CAL) 600 MG TABS Take 600 mg by mouth daily.    . diclofenac sodium (VOLTAREN) 1 % GEL APPLY 2 TO 4 GRAMS TOPICALLY TWICE DAILY AS NEEDED    . enalapril (VASOTEC) 5 MG tablet Take 5 mg by mouth daily.    . Enalapril-hydroCHLOROthiazide 5-12.5 MG tablet TAKE 1 TABLET BY MOUTH ONCE DAILY FOR 90 DAYS    . fish oil-omega-3 fatty acids 1000 MG capsule Take 1 g by mouth daily.    . hydrochlorothiazide (MICROZIDE) 12.5 MG capsule Take 12.5 mg by mouth daily.    Marland Kitchen ibuprofen (ADVIL,MOTRIN) 600 MG tablet Take 1 tablet (600 mg total) by mouth every 6 (six) hours as needed for pain. 30 tablet 0  . Multiple Vitamin (MULTIVITAMIN WITH MINERALS) TABS Take 1 tablet by mouth daily.    Marland Kitchen oxymetazoline (NASAL SPRAY 12 HOUR) 0.05 % nasal spray Place 2 sprays into the nose daily as needed. For congestion/ relief    . pravastatin (PRAVACHOL) 20 MG tablet Take 20 mg by mouth every evening.    . Vitamin D, Ergocalciferol, (DRISDOL) 50000 UNITS CAPS capsule Take 50,000 Units by mouth every 7 (seven) days.     No current facility-administered medications on file prior to visit.     Allergies  Allergen Reactions  . Shellfish Allergy Anaphylaxis  . Lovastatin     REACTION: myalgias    Objective: Physical  Exam  General: Well developed, nourished, no acute distress, awake, alert and oriented x 3  Vascular: Dorsalis pedis artery 2/4 bilateral, Posterior tibial artery 2/4 bilateral, skin temperature warm to warm proximal to distal bilateral lower extremities, no varicosities, pedal hair present bilateral.  Neurological: Gross sensation present via light touch bilateral.   Dermatological: Skin is warm, dry, and supple bilateral, Nails 1-10 are tender, short thick, and discolored with mild subungal debris, no webspace macerations present bilateral, no open lesions present bilateral, no callus/corns/hyperkeratotic tissue present bilateral. No signs of infection bilateral.  Musculoskeletal:+ dislocated boney deformities noted at right 2nd toe with pain sub met head and neuritis. Muscular strength within normal limits without painon range of motion. No pain with calf compression bilateral.  Fungal culture- Microtrauma   Assessment and Plan:  Problem List Items Addressed This Visit    None    Visit Diagnoses    Nail fungus    -  Primary   Capsulitis       Neuritis due to DM [facility])          -Examined patient -Discussed treatment options for painful dystrophic nail secondary to microtrauma -Recommend vicks to nails -Advised good hygiene habits -After oral consent and aseptic prep, injected a mixture containing 1 ml of 2%  plain lidocaine, 1 ml 0.5% plain marcaine, 0.5 ml  of kenalog 10 and 0.5 ml of dexamethasone phosphate into R 2nd toe without complication. Post-injection care discussed with patient.  -Patient to return in 2 weeks if pain is not better on right or sooner if symptoms worsen.  Landis Martins, DPM

## 2018-05-17 DIAGNOSIS — R69 Illness, unspecified: Secondary | ICD-10-CM | POA: Diagnosis not present

## 2018-05-28 DIAGNOSIS — E1122 Type 2 diabetes mellitus with diabetic chronic kidney disease: Secondary | ICD-10-CM | POA: Diagnosis not present

## 2018-05-28 DIAGNOSIS — R32 Unspecified urinary incontinence: Secondary | ICD-10-CM | POA: Diagnosis not present

## 2018-05-28 DIAGNOSIS — Z6834 Body mass index (BMI) 34.0-34.9, adult: Secondary | ICD-10-CM | POA: Diagnosis not present

## 2018-05-28 DIAGNOSIS — E669 Obesity, unspecified: Secondary | ICD-10-CM | POA: Diagnosis not present

## 2018-05-28 DIAGNOSIS — I129 Hypertensive chronic kidney disease with stage 1 through stage 4 chronic kidney disease, or unspecified chronic kidney disease: Secondary | ICD-10-CM | POA: Diagnosis not present

## 2018-05-28 DIAGNOSIS — Z809 Family history of malignant neoplasm, unspecified: Secondary | ICD-10-CM | POA: Diagnosis not present

## 2018-05-28 DIAGNOSIS — E785 Hyperlipidemia, unspecified: Secondary | ICD-10-CM | POA: Diagnosis not present

## 2018-05-28 DIAGNOSIS — N189 Chronic kidney disease, unspecified: Secondary | ICD-10-CM | POA: Diagnosis not present

## 2018-05-28 DIAGNOSIS — Z7984 Long term (current) use of oral hypoglycemic drugs: Secondary | ICD-10-CM | POA: Diagnosis not present

## 2018-05-28 DIAGNOSIS — R69 Illness, unspecified: Secondary | ICD-10-CM | POA: Diagnosis not present

## 2018-06-12 ENCOUNTER — Encounter (INDEPENDENT_AMBULATORY_CARE_PROVIDER_SITE_OTHER): Payer: Self-pay | Admitting: *Deleted

## 2018-06-12 ENCOUNTER — Telehealth (INDEPENDENT_AMBULATORY_CARE_PROVIDER_SITE_OTHER): Payer: Self-pay | Admitting: *Deleted

## 2018-06-12 MED ORDER — PEG 3350-KCL-NA BICARB-NACL 420 G PO SOLR: 4000.0000 mL | Freq: Once | ORAL | 0 refills | Status: AC

## 2018-06-12 NOTE — Telephone Encounter (Signed)
Patient needs trilyte 

## 2018-09-25 ENCOUNTER — Emergency Department (HOSPITAL_COMMUNITY)
Admission: EM | Admit: 2018-09-25 | Discharge: 2018-09-25 | Disposition: A | Discharge status: Home / Self Care | Payer: Medicare HMO | Attending: Emergency Medicine | Admitting: Emergency Medicine

## 2018-09-25 ENCOUNTER — Emergency Department (HOSPITAL_COMMUNITY): Payer: Medicare HMO

## 2018-09-25 ENCOUNTER — Other Ambulatory Visit: Payer: Self-pay

## 2018-09-25 ENCOUNTER — Encounter (HOSPITAL_COMMUNITY): Payer: Self-pay

## 2018-09-25 DIAGNOSIS — I10 Essential (primary) hypertension: Secondary | ICD-10-CM | POA: Diagnosis not present

## 2018-09-25 DIAGNOSIS — R197 Diarrhea, unspecified: Secondary | ICD-10-CM | POA: Diagnosis not present

## 2018-09-25 DIAGNOSIS — E119 Type 2 diabetes mellitus without complications: Secondary | ICD-10-CM | POA: Diagnosis not present

## 2018-09-25 DIAGNOSIS — R270 Ataxia, unspecified: Secondary | ICD-10-CM | POA: Diagnosis not present

## 2018-09-25 DIAGNOSIS — R42 Dizziness and giddiness: Secondary | ICD-10-CM | POA: Diagnosis not present

## 2018-09-25 DIAGNOSIS — R0602 Shortness of breath: Secondary | ICD-10-CM | POA: Diagnosis not present

## 2018-09-25 LAB — COMPREHENSIVE METABOLIC PANEL
ALT: 11 U/L (ref 0–44)
AST: 15 U/L (ref 15–41)
Albumin: 4.2 g/dL (ref 3.5–5.0)
Alkaline Phosphatase: 49 U/L (ref 38–126)
Anion gap: 10 (ref 5–15)
BUN: 18 mg/dL (ref 8–23)
CO2: 25 mmol/L (ref 22–32)
Calcium: 9 mg/dL (ref 8.9–10.3)
Chloride: 101 mmol/L (ref 98–111)
Creatinine, Ser: 1.21 mg/dL — ABNORMAL HIGH (ref 0.44–1.00)
GFR calc Af Amer: 52 mL/min — ABNORMAL LOW (ref >60)
GFR calc non Af Amer: 45 mL/min — ABNORMAL LOW (ref >60)
Glucose, Bld: 132 mg/dL — ABNORMAL HIGH (ref 70–99)
Potassium: 3.7 mmol/L (ref 3.5–5.1)
Sodium: 136 mmol/L (ref 135–145)
Total Bilirubin: 0.6 mg/dL (ref 0.3–1.2)
Total Protein: 7.3 g/dL (ref 6.5–8.1)

## 2018-09-25 LAB — CBC WITH DIFFERENTIAL/PLATELET
Abs Immature Granulocytes: 0.01 10*3/uL (ref 0.00–0.07)
Basophils Absolute: 0 10*3/uL (ref 0.0–0.1)
Basophils Relative: 0 %
Eosinophils Absolute: 0 10*3/uL (ref 0.0–0.5)
Eosinophils Relative: 1 %
HCT: 41.3 % (ref 36.0–46.0)
Hemoglobin: 12.9 g/dL (ref 12.0–15.0)
Immature Granulocytes: 0 %
Lymphocytes Relative: 38 %
Lymphs Abs: 1.5 10*3/uL (ref 0.7–4.0)
MCH: 28.5 pg (ref 26.0–34.0)
MCHC: 31.2 g/dL (ref 30.0–36.0)
MCV: 91.4 fL (ref 80.0–100.0)
Monocytes Absolute: 0.3 10*3/uL (ref 0.1–1.0)
Monocytes Relative: 8 %
Neutro Abs: 2.2 10*3/uL (ref 1.7–7.7)
Neutrophils Relative %: 53 %
Platelets: 157 10*3/uL (ref 150–400)
RBC: 4.52 MIL/uL (ref 3.87–5.11)
RDW: 14.3 % (ref 11.5–15.5)
WBC: 4 10*3/uL (ref 4.0–10.5)
nRBC: 0 % (ref 0.0–0.2)

## 2018-09-25 LAB — URINALYSIS, ROUTINE W REFLEX MICROSCOPIC
Bilirubin Urine: NEGATIVE
Glucose, UA: NEGATIVE mg/dL
Hgb urine dipstick: NEGATIVE
Ketones, ur: NEGATIVE mg/dL
Leukocytes,Ua: NEGATIVE
Nitrite: NEGATIVE
Protein, ur: NEGATIVE mg/dL
Specific Gravity, Urine: 1.013 (ref 1.005–1.030)
pH: 7 (ref 5.0–8.0)

## 2018-09-25 LAB — TSH: TSH: 1.276 u[IU]/mL (ref 0.350–4.500)

## 2018-09-25 LAB — TROPONIN I (HIGH SENSITIVITY)
Troponin I (High Sensitivity): 5 ng/L (ref <18)
Troponin I (High Sensitivity): 8 ng/L (ref <18)

## 2018-09-25 MED ORDER — SODIUM CHLORIDE 0.9 % IV BOLUS
500.0000 mL | Freq: Once | INTRAVENOUS | Status: AC
  Administered 2018-09-25: 500 mL via INTRAVENOUS

## 2018-09-25 MED ORDER — LORAZEPAM 2 MG/ML IJ SOLN
1.0000 mg | Freq: Once | INTRAMUSCULAR | Status: AC | PRN
  Administered 2018-09-25: 12:00:00 1 mg via INTRAVENOUS
  Filled 2018-09-25: qty 1

## 2018-09-25 NOTE — ED Provider Notes (Signed)
Emergency Department Provider Note   I have reviewed the triage vital signs and the nursing notes.   HISTORY  Chief Complaint Dizziness   HPI Terri Miranda is a 73 y.o. female with PMH of HTN, HLD, and MVP presents to the emergency department with "dizziness" feeling which was present upon waking up this morning.  She describes feeling lightheaded and "out of it."  She had some mild shortness of breath but no chest pain.  No palpitations.  No fevers or chills.  No recent infections, medication changes, herbal supplements.  She denies falling but felt somewhat off balance.  Her symptoms have improved but continues to feel "a little off" but has difficulty describing beyond that.  She denies any unilateral weakness or numbness.  No vision changes such as blurry vision or double vision.  Denies sudden severe headache.  She called EMS who report initial systolic blood pressure of 200 on scene.    Past Medical History:  Diagnosis Date  . Essential hypertension   . Mitral valve prolapse   . Mixed hyperlipidemia   . Palpitations   . Pre-diabetes     Patient Active Problem List   Diagnosis Date Noted  . Special screening for malignant neoplasms, colon 05/03/2018  . Ankle pain 10/16/2015  . UTI 08/08/2008  . BACK PAIN 05/19/2008  . DIZZINESS 05/19/2008  . DIABETES MELLITUS, TYPE II 02/19/2008  . HYPERLIPIDEMIA 02/19/2008  . HYPERTENSION 02/19/2008  . ALLERGIC RHINITIS 02/19/2008    Past Surgical History:  Procedure Laterality Date  . BREAST EXCISIONAL BIOPSY Right     Allergies Shellfish allergy and Lovastatin  Family History  Problem Relation Age of Onset  . Hypertension Mother   . CVA Mother   . Coronary artery disease Father   . Prostate cancer Father   . Hypertension Sister   . Coronary artery disease Sister   . Breast cancer Maternal Aunt     Social History Social History   Tobacco Use  . Smoking status: Never Smoker  . Smokeless tobacco: Never Used   Substance Use Topics  . Alcohol use: Yes    Alcohol/week: 0.0 standard drinks    Comment: Occasional wine  . Drug use: Not on file    Review of Systems  Constitutional: No fever/chills. Positive dizziness.  Eyes: No visual changes. ENT: No sore throat. Cardiovascular: Denies chest pain. Respiratory: Mild shortness of breath. Gastrointestinal: No abdominal pain.  No nausea, no vomiting.  No diarrhea.  No constipation. Genitourinary: Negative for dysuria. Musculoskeletal: Negative for back pain. Skin: Negative for rash. Neurological: Negative for headaches, focal weakness or numbness.  10-point ROS otherwise negative.  ____________________________________________   PHYSICAL EXAM:  VITAL SIGNS: ED Triage Vitals  Enc Vitals Group     BP 09/25/18 0931 (!) 169/69     Pulse Rate 09/25/18 0931 76     Resp 09/25/18 0931 12     Temp 09/25/18 0931 98.4 F (36.9 C)     Temp Source 09/25/18 0931 Oral     SpO2 09/25/18 0931 100 %     Weight 09/25/18 0929 200 lb (90.7 kg)     Height 09/25/18 0929 5' 7.5" (1.715 m)   Constitutional: Alert and oriented. Well appearing and in no acute distress. Eyes: Conjunctivae are normal. PERRL. EOMI.  Head: Atraumatic. Nose: No congestion/rhinnorhea. Mouth/Throat: Mucous membranes are moist.  Neck: No stridor.  Cardiovascular: Normal rate, regular rhythm. Good peripheral circulation. Grossly normal heart sounds.   Respiratory: Normal respiratory effort.  No retractions. Lungs CTAB. Gastrointestinal: Soft and nontender. No distention.  Musculoskeletal: No lower extremity tenderness. No gross deformities of extremities. Neurologic:  Normal speech and language. No gross focal neurologic deficits are appreciated. Normal CN exam 2-12. Normal finger-to-nose testing. Normal strength and sensation in the upper and lower extremities.  Skin:  Skin is warm, dry and intact. No rash noted.   ____________________________________________   LABS (all labs  ordered are listed, but only abnormal results are displayed)  Labs Reviewed  COMPREHENSIVE METABOLIC PANEL - Abnormal; Notable for the following components:      Result Value   Glucose, Bld 132 (*)    Creatinine, Ser 1.21 (*)    GFR calc non Af Amer 45 (*)    GFR calc Af Amer 52 (*)    All other components within normal limits  CBC WITH DIFFERENTIAL/PLATELET  URINALYSIS, ROUTINE W REFLEX MICROSCOPIC  TSH  TROPONIN I (HIGH SENSITIVITY)  TROPONIN I (HIGH SENSITIVITY)   ____________________________________________  EKG   EKG Interpretation  Date/Time:  Tuesday September 25 2018 09:33:25 EDT Ventricular Rate:  76 PR Interval:    QRS Duration: 136 QT Interval:  414 QTC Calculation: 469 R Axis:   -3 Text Interpretation:  Sinus rhythm Nonspecific intraventricular conduction delay Nonspecific repol abnormality, diffuse leads No STEMI. No old tracing for comparison.  Confirmed by Nanda Quinton [phone]) on 09/25/2018 9:43:01 AM      ____________________________________________  RADIOLOGY  Ct Head Wo Contrast  Result Date: 09/25/2018 CLINICAL DATA:  Dizziness since this morning EXAM: CT HEAD WITHOUT CONTRAST TECHNIQUE: Contiguous axial images were obtained from the base of the skull through the vertex without intravenous contrast. COMPARISON:  None. FINDINGS: Brain: No evidence of acute infarction, hemorrhage, hydrocephalus, extra-axial collection or mass lesion/mass effect. Mild white matter disease attributed to chronic small vessel ischemia. Vascular: No hyperdense vessel or unexpected calcification. Skull: Normal. Negative for fracture or focal lesion. Sinuses/Orbits: Negative IMPRESSION: No acute or reversible finding. Electronically Signed   By: Monte Fantasia M.D.   On: 09/25/2018 10:19   Mr Brain Wo Contrast  Result Date: 09/25/2018 CLINICAL DATA:  Ataxia rule out stroke EXAM: MRI HEAD WITHOUT CONTRAST TECHNIQUE: Multiplanar, multiecho pulse sequences of the brain and surrounding  structures were obtained without intravenous contrast. COMPARISON:  CT head 09/25/2018 FINDINGS: Brain: Negative for acute infarct. Ventricle size and cerebral volume normal. Scattered patchy small white matter hyperintensities bilaterally appear chronic. Negative for hemorrhage or mass. Vascular: Normal arterial flow voids. Skull and upper cervical spine: No focal bone lesion. Sinuses/Orbits: Negative Other: None IMPRESSION: Negative for acute infarct. Mild chronic microvascular ischemic changes in the white matter. Electronically Signed   By: Franchot Gallo M.D.   On: 09/25/2018 12:28   Dg Chest Portable 1 View  Result Date: 09/25/2018 CLINICAL DATA:  Shortness of breath and dizziness EXAM: PORTABLE CHEST 1 VIEW COMPARISON:  None. FINDINGS: The heart size and mediastinal contours are within normal limits. Both lungs are clear. The visualized skeletal structures are unremarkable. IMPRESSION: No active disease. Electronically Signed   By: Inez Catalina M.D.   On: 09/25/2018 09:54    ____________________________________________   PROCEDURES  Procedure(s) performed:   Procedures  None  ____________________________________________   INITIAL IMPRESSION / ASSESSMENT AND PLAN / ED COURSE  Pertinent labs & imaging results that were available during my care of the patient were reviewed by me and considered in my medical decision making (see chart for details).   Patient presents to the emergency department with dizzy  feeling after waking up this morning.  Symptoms continued and EMS was called to recorded elevated systolic blood pressure on scene. Broad differential at this time including but not limited to arrhythmia, ACS, infection, and CVA. Blood pressure has since decreased without intervention.  Patient has no focal neuro deficits.  Continues to describe a mild, nonspecific lightheadedness.  EKG with some artifact and nonspecific T wave changes.  No STEMI.  No old tracing for comparison.   Patient CT imaging and lab work reviewed.  No acute findings.  Proceeded with MRI of the brain which showed no acute process.  Patient feeling improved after IV fluids.  Creatinine is slightly elevated.  Question mild dehydration.  Plan for PCP follow-up and return to the emergency department with any new or suddenly worsening symptoms.  ____________________________________________  FINAL CLINICAL IMPRESSION(S) / ED DIAGNOSES  Final diagnoses:  Dizziness    MEDICATIONS GIVEN DURING THIS VISIT:  Medications  sodium chloride 0.9 % bolus 500 mL (0 mLs Intravenous Stopped 09/25/18 1245)  LORazepam (ATIVAN) injection 1 mg (1 mg Intravenous Given 09/25/18 1141)    Note:  This document was prepared using Dragon voice recognition software and may include unintentional dictation errors.  Nanda Quinton, MD Emergency Medicine    Long, [facility], MD 09/26/18 [phone]

## 2018-09-25 NOTE — ED Triage Notes (Signed)
Pt has been feeling dizzy this morning since she woke up. Hx of HTN. Systolic BP 834 with EMS. Upon arrival to ED, systolic BP 621. CBG 159 with EMS. Pt ambulatory to stretcher.

## 2018-09-25 NOTE — ED Notes (Signed)
ED Provider at bedside. 

## 2018-09-25 NOTE — Discharge Instructions (Signed)
You were seen in the emergency department today with dizziness.  Your lab work and MRI look normal.  Do not see any evidence of stroke or heart attack.  You do show some evidence of mild dehydration in your lab work.  Please drink plenty of fluids but more importantly follow closely with your primary care physician.  Please call today to schedule the next available appointment.  Return to the emergency department if you develop any new or suddenly worsening symptoms.

## 2018-09-25 NOTE — ED Notes (Signed)
Pt ambulated in hallway , denied dizziness. Steady gait

## 2018-10-08 DIAGNOSIS — E1169 Type 2 diabetes mellitus with other specified complication: Secondary | ICD-10-CM | POA: Diagnosis not present

## 2018-10-08 DIAGNOSIS — E669 Obesity, unspecified: Secondary | ICD-10-CM | POA: Diagnosis not present

## 2018-10-08 DIAGNOSIS — R69 Illness, unspecified: Secondary | ICD-10-CM | POA: Diagnosis not present

## 2018-10-08 DIAGNOSIS — I10 Essential (primary) hypertension: Secondary | ICD-10-CM | POA: Diagnosis not present

## 2018-10-08 DIAGNOSIS — Z7984 Long term (current) use of oral hypoglycemic drugs: Secondary | ICD-10-CM | POA: Diagnosis not present

## 2018-10-10 ENCOUNTER — Encounter (INDEPENDENT_AMBULATORY_CARE_PROVIDER_SITE_OTHER): Payer: Self-pay | Admitting: *Deleted

## 2018-10-10 ENCOUNTER — Telehealth (INDEPENDENT_AMBULATORY_CARE_PROVIDER_SITE_OTHER): Payer: Self-pay | Admitting: *Deleted

## 2018-10-10 NOTE — Telephone Encounter (Signed)
Referring MD/PCP: [email or web address]   Procedure: tcs  Reason/Indication:  screening  Has patient had this procedure before?  Yes, 10 yrs ago  If so, when, by whom and where?    Is there a family history of colon cancer?  no  Who?  What age when diagnosed?    Is patient diabetic?   yes      Does patient have prosthetic heart valve or mechanical valve?  no  Do you have a pacemaker?  no  Has patient ever had endocarditis? no  Has patient had joint replacement within last 12 months?  no  Is patient constipated or do they take laxatives? no  Does patient have a history of alcohol/drug use?  no  Is patient on blood thinner such as Coumadin, Plavix and/or Aspirin? yes  Medications: asa 81 mg daily, vit d daily, fish oil daily, metformin 500 mg daily, pravastatin 20 mg daily, enalapril 5 mg daily, hctz 12.5 mg daily, tylenol  Allergies: see epic  Medication Adjustment per Dr Lindi Adie, NP: asa 2 days, hold metformin day befpre  Procedure date & time: 11/14/18 at 830

## 2018-10-11 NOTE — Telephone Encounter (Signed)
agree

## 2018-10-23 ENCOUNTER — Telehealth (INDEPENDENT_AMBULATORY_CARE_PROVIDER_SITE_OTHER): Payer: Self-pay | Admitting: *Deleted

## 2018-10-23 MED ORDER — PEG 3350-KCL-NA BICARB-NACL 420 G PO SOLR: 4000.0000 mL | Freq: Once | ORAL | 0 refills | Status: AC

## 2018-10-23 NOTE — Telephone Encounter (Signed)
patient needs trilyte TCS sch'd in Sept

## 2018-11-12 ENCOUNTER — Other Ambulatory Visit (HOSPITAL_COMMUNITY)
Admission: RE | Admit: 2018-11-12 | Discharge: 2018-11-12 | Disposition: A | Discharge status: Home / Self Care | Payer: Medicare HMO | Source: Ambulatory Visit | Attending: Internal Medicine | Admitting: Internal Medicine

## 2018-11-12 ENCOUNTER — Other Ambulatory Visit: Payer: Self-pay

## 2018-11-12 DIAGNOSIS — Z20828 Contact with and (suspected) exposure to other viral communicable diseases: Secondary | ICD-10-CM | POA: Insufficient documentation

## 2018-11-12 DIAGNOSIS — Z01812 Encounter for preprocedural laboratory examination: Secondary | ICD-10-CM | POA: Diagnosis not present

## 2018-11-12 LAB — SARS CORONAVIRUS 2 (TAT 6-24 HRS): SARS Coronavirus 2: NEGATIVE

## 2018-11-14 ENCOUNTER — Ambulatory Visit (HOSPITAL_COMMUNITY)
Admission: RE | Admit: 2018-11-14 | Discharge: 2018-11-14 | Disposition: A | Discharge status: Home / Self Care | Payer: Medicare HMO | Attending: Internal Medicine | Admitting: Internal Medicine

## 2018-11-14 ENCOUNTER — Other Ambulatory Visit: Payer: Self-pay

## 2018-11-14 ENCOUNTER — Encounter (HOSPITAL_COMMUNITY)
Admission: RE | Disposition: A | Discharge status: Home / Self Care | Payer: Self-pay | Source: Home / Self Care | Attending: Internal Medicine

## 2018-11-14 ENCOUNTER — Encounter (HOSPITAL_COMMUNITY): Payer: Self-pay | Admitting: *Deleted

## 2018-11-14 DIAGNOSIS — Z8042 Family history of malignant neoplasm of prostate: Secondary | ICD-10-CM | POA: Insufficient documentation

## 2018-11-14 DIAGNOSIS — I10 Essential (primary) hypertension: Secondary | ICD-10-CM | POA: Insufficient documentation

## 2018-11-14 DIAGNOSIS — Z79899 Other long term (current) drug therapy: Secondary | ICD-10-CM | POA: Insufficient documentation

## 2018-11-14 DIAGNOSIS — D121 Benign neoplasm of appendix: Secondary | ICD-10-CM | POA: Diagnosis not present

## 2018-11-14 DIAGNOSIS — Z791 Long term (current) use of non-steroidal anti-inflammatories (NSAID): Secondary | ICD-10-CM | POA: Insufficient documentation

## 2018-11-14 DIAGNOSIS — K644 Residual hemorrhoidal skin tags: Secondary | ICD-10-CM | POA: Insufficient documentation

## 2018-11-14 DIAGNOSIS — Z1211 Encounter for screening for malignant neoplasm of colon: Secondary | ICD-10-CM | POA: Insufficient documentation

## 2018-11-14 DIAGNOSIS — R002 Palpitations: Secondary | ICD-10-CM | POA: Insufficient documentation

## 2018-11-14 DIAGNOSIS — Z91013 Allergy to seafood: Secondary | ICD-10-CM | POA: Insufficient documentation

## 2018-11-14 DIAGNOSIS — Z803 Family history of malignant neoplasm of breast: Secondary | ICD-10-CM | POA: Diagnosis not present

## 2018-11-14 DIAGNOSIS — D123 Benign neoplasm of transverse colon: Secondary | ICD-10-CM | POA: Insufficient documentation

## 2018-11-14 DIAGNOSIS — K5730 Diverticulosis of large intestine without perforation or abscess without bleeding: Secondary | ICD-10-CM | POA: Diagnosis not present

## 2018-11-14 DIAGNOSIS — E782 Mixed hyperlipidemia: Secondary | ICD-10-CM | POA: Diagnosis not present

## 2018-11-14 DIAGNOSIS — R7303 Prediabetes: Secondary | ICD-10-CM | POA: Insufficient documentation

## 2018-11-14 DIAGNOSIS — Z7984 Long term (current) use of oral hypoglycemic drugs: Secondary | ICD-10-CM | POA: Insufficient documentation

## 2018-11-14 DIAGNOSIS — Z7982 Long term (current) use of aspirin: Secondary | ICD-10-CM | POA: Insufficient documentation

## 2018-11-14 DIAGNOSIS — Z823 Family history of stroke: Secondary | ICD-10-CM | POA: Insufficient documentation

## 2018-11-14 DIAGNOSIS — I341 Nonrheumatic mitral (valve) prolapse: Secondary | ICD-10-CM | POA: Insufficient documentation

## 2018-11-14 DIAGNOSIS — Z8249 Family history of ischemic heart disease and other diseases of the circulatory system: Secondary | ICD-10-CM | POA: Insufficient documentation

## 2018-11-14 DIAGNOSIS — Z888 Allergy status to other drugs, medicaments and biological substances status: Secondary | ICD-10-CM | POA: Diagnosis not present

## 2018-11-14 DIAGNOSIS — D120 Benign neoplasm of cecum: Secondary | ICD-10-CM | POA: Insufficient documentation

## 2018-11-14 HISTORY — PX: POLYPECTOMY: SHX5525

## 2018-11-14 HISTORY — PX: COLONOSCOPY: SHX5424

## 2018-11-14 LAB — GLUCOSE, CAPILLARY: Glucose-Capillary: 113 mg/dL — ABNORMAL HIGH (ref 70–99)

## 2018-11-14 SURGERY — COLONOSCOPY
Anesthesia: Moderate Sedation

## 2018-11-14 MED ORDER — MIDAZOLAM HCL 5 MG/5ML IJ SOLN
INTRAMUSCULAR | Status: DC | PRN
  Administered 2018-11-14: 2 mg via INTRAVENOUS
  Administered 2018-11-14: 1 mg via INTRAVENOUS
  Administered 2018-11-14: 2 mg via INTRAVENOUS
  Administered 2018-11-14 (×2): 1 mg via INTRAVENOUS

## 2018-11-14 MED ORDER — STERILE WATER FOR IRRIGATION IR SOLN
Status: DC | PRN
  Administered 2018-11-14: 08:00:00 2.5 mL

## 2018-11-14 MED ORDER — MEPERIDINE HCL 50 MG/ML IJ SOLN
INTRAMUSCULAR | Status: AC
  Filled 2018-11-14: qty 1

## 2018-11-14 MED ORDER — SODIUM CHLORIDE 0.9 % IV SOLN
INTRAVENOUS | Status: DC
  Administered 2018-11-14: 08:00:00 via INTRAVENOUS

## 2018-11-14 MED ORDER — MEPERIDINE HCL 50 MG/ML IJ SOLN
INTRAMUSCULAR | Status: DC | PRN
  Administered 2018-11-14 (×2): 25 mg via INTRAVENOUS

## 2018-11-14 MED ORDER — MIDAZOLAM HCL 5 MG/5ML IJ SOLN
INTRAMUSCULAR | Status: AC
  Filled 2018-11-14: qty 10

## 2018-11-14 NOTE — Discharge Instructions (Signed)
No aspirin or NSAIDs for 24 hours. Resume other medications as before. Modified carb high-fiber diet. No driving for 24 hours. Physician will call with biopsy results.   Colonoscopy, Adult, Care After This sheet gives you information about how to care for yourself after your procedure. Your doctor may also give you more specific instructions. If you have problems or questions, call your doctor. What can I expect after the procedure? After the procedure, it is common to have:  A small amount of blood in your poop for 24 hours.  Some gas.  Mild cramping or bloating in your belly. Follow these instructions at home: General instructions  For the first 24 hours after the procedure: ? Do not drive or use machinery. ? Do not sign important documents. ? Do not drink alcohol. ? Do your daily activities more slowly than normal. ? Eat foods that are soft and easy to digest.  Take over-the-counter or prescription medicines only as told by your doctor. To help cramping and bloating:   Try walking around.  Put heat on your belly (abdomen) as told by your doctor. Use a heat source that your doctor recommends, such as a moist heat pack or a heating pad. ? Put a towel between your skin and the heat source. ? Leave the heat on for 20-30 minutes. ? Remove the heat if your skin turns bright red. This is especially important if you cannot feel pain, heat, or cold. You can get burned. Eating and drinking   Drink enough fluid to keep your pee (urine) clear or pale yellow.  Return to your normal diet as told by your doctor. Avoid heavy or fried foods that are hard to digest.  Avoid drinking alcohol for as long as told by your doctor. Contact a doctor if:  You have blood in your poop (stool) 2-3 days after the procedure. Get help right away if:  You have more than a small amount of blood in your poop.  You see large clumps of tissue (blood clots) in your poop.  Your belly is  swollen.  You feel sick to your stomach (nauseous).  You throw up (vomit).  You have a fever.  You have belly pain that gets worse, and medicine does not help your pain. Summary  After the procedure, it is common to have a small amount of blood in your poop. You may also have mild cramping and bloating in your belly.  For the first 24 hours after the procedure, do not drive or use machinery, do not sign important documents, and do not drink alcohol.  Get help right away if you have a lot of blood in your poop, feel sick to your stomach, have a fever, or have more belly pain. This information is not intended to replace advice given to you by your health care provider. Make sure you discuss any questions you have with your health care provider. Document Released: 04/02/2010 Document Revised: 12/29/2016 Document Reviewed: 11/23/2015 Elsevier Patient Education  2020 [employer].  Diverticulosis  Diverticulosis is a condition that develops when small pouches (diverticula) form in the wall of the large intestine (colon). The colon is where water is absorbed and stool is formed. The pouches form when the inside layer of the colon pushes through weak spots in the outer layers of the colon. You may have a few pouches or many of them. What are the causes? The cause of this condition is not known. What increases the risk? The following factors may  make you more likely to develop this condition:  Being older than age 27. Your risk for this condition increases with age. Diverticulosis is rare among people younger than age 31. By age 1, many people have it.  Eating a low-fiber diet.  Having frequent constipation.  Being overweight.  Not getting enough exercise.  Smoking.  Taking over-the-counter pain medicines, like aspirin and ibuprofen.  Having a family history of diverticulosis. What are the signs or symptoms? In most people, there are no symptoms of this condition. If you do have  symptoms, they may include:  Bloating.  Cramps in the abdomen.  Constipation or diarrhea.  Pain in the lower left side of the abdomen. How is this diagnosed? This condition is most often diagnosed during an exam for other colon problems. Because diverticulosis usually has no symptoms, it often cannot be diagnosed independently. This condition may be diagnosed by:  Using a flexible scope to examine the colon (colonoscopy).  Taking an X-ray of the colon after dye has been put into the colon (barium enema).  Doing a CT scan. How is this treated? You may not need treatment for this condition if you have never developed an infection related to diverticulosis. If you have had an infection before, treatment may include:  Eating a high-fiber diet. This may include eating more fruits, vegetables, and grains.  Taking a fiber supplement.  Taking a live bacteria supplement (probiotic).  Taking medicine to relax your colon.  Taking antibiotic medicines. Follow these instructions at home:  Drink 6-8 glasses of water or more each day to prevent constipation.  Try not to strain when you have a bowel movement.  If you have had an infection before: ? Eat more fiber as directed by your health care provider or your diet and nutrition specialist (dietitian). ? Take a fiber supplement or probiotic, if your health care provider approves.  Take over-the-counter and prescription medicines only as told by your health care provider.  If you were prescribed an antibiotic, take it as told by your health care provider. Do not stop taking the antibiotic even if you start to feel better.  Keep all follow-up visits as told by your health care provider. This is important. Contact a health care provider if:  You have pain in your abdomen.  You have bloating.  You have cramps.  You have not had a bowel movement in 3 days. Get help right away if:  Your pain gets worse.  Your bloating becomes very  bad.  You have a fever or chills, and your symptoms suddenly get worse.  You vomit.  You have bowel movements that are bloody or black.  You have bleeding from your rectum. Summary  Diverticulosis is a condition that develops when small pouches (diverticula) form in the wall of the large intestine (colon).  You may have a few pouches or many of them.  This condition is most often diagnosed during an exam for other colon problems.  If you have had an infection related to diverticulosis, treatment may include increasing the fiber in your diet, taking supplements, or taking medicines. This information is not intended to replace advice given to you by your health care provider. Make sure you discuss any questions you have with your health care provider. Document Released: 11/26/2003 Document Revised: 02/10/2017 Document Reviewed: 01/18/2016 Elsevier Patient Education  [place].  Colon Polyps  Polyps are tissue growths inside the body. Polyps can grow in many places, including the large intestine (colon).  A polyp may be a round bump or a mushroom-shaped growth. You could have one polyp or several. Most colon polyps are noncancerous (benign). However, some colon polyps can become cancerous over time. Finding and removing the polyps early can help prevent this. What are the causes? The exact cause of colon polyps is not known. What increases the risk? You are more likely to develop this condition if you:  Have a family history of colon cancer or colon polyps.  Are older than 79 or older than 45 if you are African American.  Have inflammatory bowel disease, such as ulcerative colitis or Crohn's disease.  Have certain hereditary conditions, such as: ? Familial adenomatous polyposis. ? Lynch syndrome. ? Turcot syndrome. ? Peutz-Jeghers syndrome.  Are overweight.  Smoke cigarettes.  Do not get enough exercise.  Drink too much alcohol.  Eat a diet that is high in fat  and red meat and low in fiber.  Had childhood cancer that was treated with abdominal radiation. What are the signs or symptoms? Most polyps do not cause symptoms. If you have symptoms, they may include:  Blood coming from your rectum when having a bowel movement.  Blood in your stool. The stool may look dark red or black.  Abdominal pain.  A change in bowel habits, such as constipation or diarrhea. How is this diagnosed? This condition is diagnosed with a colonoscopy. This is a procedure in which a lighted, flexible scope is inserted into the anus and then passed into the colon to examine the area. Polyps are sometimes found when a colonoscopy is done as part of routine cancer screening tests. How is this treated? Treatment for this condition involves removing any polyps that are found. Most polyps can be removed during a colonoscopy. Those polyps will then be tested for cancer. Additional treatment may be needed depending on the results of testing. Follow these instructions at home: Lifestyle  Maintain a healthy weight, or lose weight if recommended by your health care provider.  Exercise every day or as told by your health care provider.  Do not use any products that contain nicotine or tobacco, such as cigarettes and e-cigarettes. If you need help quitting, ask your health care provider.  If you drink alcohol, limit how much you have: ? 0-1 drink a day for women. ? 0-2 drinks a day for men.  Be aware of how much alcohol is in your drink. In the [place] one drink equals one 12 oz bottle of beer (355 mL), one 5 oz glass of wine (148 mL), or one 1 oz shot of hard liquor (44 mL). Eating and drinking   Eat foods that are high in fiber, such as fruits, vegetables, and whole grains.  Eat foods that are high in calcium and vitamin D, such as milk, cheese, yogurt, eggs, liver, fish, and broccoli.  Limit foods that are high in fat, such as fried foods and desserts.  Limit the amount  of red meat and processed meat you eat, such as hot dogs, sausage, bacon, and lunch meats. General instructions  Keep all follow-up visits as told by your health care provider. This is important. ? This includes having regularly scheduled colonoscopies. ? Talk to your health care provider about when you need a colonoscopy. Contact a health care provider if:  You have new or worsening bleeding during a bowel movement.  You have new or increased blood in your stool.  You have a change in bowel habits.  You lose weight  for no known reason. Summary  Polyps are tissue growths inside the body. Polyps can grow in many places, including the colon.  Most colon polyps are noncancerous (benign), but some can become cancerous over time.  This condition is diagnosed with a colonoscopy.  Treatment for this condition involves removing any polyps that are found. Most polyps can be removed during a colonoscopy. This information is not intended to replace advice given to you by your health care provider. Make sure you discuss any questions you have with your health care provider. Document Released: 11/25/2003 Document Revised: 06/15/2017 Document Reviewed: 06/15/2017 Elsevier Patient Education  2020 [employer].  Hemorrhoids Hemorrhoids are swollen veins that may develop:  In the butt (rectum). These are called internal hemorrhoids.  Around the opening of the butt (anus). These are called external hemorrhoids. Hemorrhoids can cause pain, itching, or bleeding. Most of the time, they do not cause serious problems. They usually get better with diet changes, lifestyle changes, and other home treatments. What are the causes? This condition may be caused by:  Having trouble pooping (constipation).  Pushing hard (straining) to poop.  Watery poop (diarrhea).  Pregnancy.  Being very overweight (obese).  Sitting for long periods of time.  Heavy lifting or other activity that causes you to  strain.  Anal sex.  Riding a bike for a long period of time. What are the signs or symptoms? Symptoms of this condition include:  Pain.  Itching or soreness in the butt.  Bleeding from the butt.  Leaking poop.  Swelling in the area.  One or more lumps around the opening of your butt. How is this diagnosed? A doctor can often diagnose this condition by looking at the affected area. The doctor may also:  Do an exam that involves feeling the area with a gloved hand (digital rectal exam).  Examine the area inside your butt using a small tube (anoscope).  Order blood tests. This may be done if you have lost a lot of blood.  Have you get a test that involves looking inside the colon using a flexible tube with a camera on the end (sigmoidoscopy or colonoscopy). How is this treated? This condition can usually be treated at home. Your doctor may tell you to change what you eat, make lifestyle changes, or try home treatments. If these do not help, procedures can be done to remove the hemorrhoids or make them smaller. These may involve:  Placing rubber bands at the base of the hemorrhoids to cut off their blood supply.  Injecting medicine into the hemorrhoids to shrink them.  Shining a type of light energy onto the hemorrhoids to cause them to fall off.  Doing surgery to remove the hemorrhoids or cut off their blood supply. Follow these instructions at home: Eating and drinking   Eat foods that have a lot of fiber in them. These include whole grains, beans, nuts, fruits, and vegetables.  Ask your doctor about taking products that have added fiber (fibersupplements).  Reduce the amount of fat in your diet. You can do this by: ? Eating low-fat dairy products. ? Eating less red meat. ? Avoiding processed foods.  Drink enough fluid to keep your pee (urine) pale yellow. Managing pain and swelling   Take a warm-water bath (sitz bath) for 20 minutes to ease pain. Do this 3-4  times a day. You may do this in a bathtub or using a portable sitz bath that fits over the toilet.  If told, put ice  on the painful area. It may be helpful to use ice between your warm baths. ? Put ice in a plastic bag. ? Place a towel between your skin and the bag. ? Leave the ice on for 20 minutes, 2-3 times a day. General instructions  Take over-the-counter and prescription medicines only as told by your doctor. ? Medicated creams and medicines may be used as told.  Exercise often. Ask your doctor how much and what kind of exercise is best for you.  Go to the bathroom when you have the urge to poop. Do not wait.  Avoid pushing too hard when you poop.  Keep your butt dry and clean. Use wet toilet paper or moist towelettes after pooping.  Do not sit on the toilet for a long time.  Keep all follow-up visits as told by your doctor. This is important. Contact a doctor if you:  Have pain and swelling that do not get better with treatment or medicine.  Have trouble pooping.  Cannot poop.  Have pain or swelling outside the area of the hemorrhoids. Get help right away if you have:  Bleeding that will not stop. Summary  Hemorrhoids are swollen veins in the butt or around the opening of the butt.  They can cause pain, itching, or bleeding.  Eat foods that have a lot of fiber in them. These include whole grains, beans, nuts, fruits, and vegetables.  Take a warm-water bath (sitz bath) for 20 minutes to ease pain. Do this 3-4 times a day. This information is not intended to replace advice given to you by your health care provider. Make sure you discuss any questions you have with your health care provider. Document Released: 12/08/2007 Document Revised: 03/08/2018 Document Reviewed: 07/20/2017 Elsevier Patient Education  2020 [employer].

## 2018-11-14 NOTE — Op Note (Signed)
[facility] Patient Name: Terri Miranda Procedure Date: 11/14/2018 8:06 AM MRN: [id number] Date of Birth: 1945/04/15 Attending MD: Hildred Laser , MD CSN: [id number] Age: 73 Admit Type: Outpatient Procedure:                Colonoscopy Indications:              Screening for colorectal malignant neoplasm Providers:                Hildred Laser, MD, Otis Peak B. Sharon Seller, RN, Raphael Gibney, Technician Referring MD:             Birdie Sons, MD Medicines:                Meperidine 50 mg IV, Midazolam 7 mg IV Complications:            No immediate complications. Estimated Blood Loss:     Estimated blood loss was minimal. Procedure:                Pre-Anesthesia Assessment:                           - Prior to the procedure, a History and Physical                            was performed, and patient medications and                            allergies were reviewed. The patient's tolerance of                            previous anesthesia was also reviewed. The risks                            and benefits of the procedure and the sedation                            options and risks were discussed with the patient.                            All questions were answered, and informed consent                            was obtained. Prior Anticoagulants: The patient has                            taken no previous anticoagulant or antiplatelet                            agents except for aspirin. ASA Grade Assessment: II                            - A patient with mild systemic disease. After  reviewing the risks and benefits, the patient was                            deemed in satisfactory condition to undergo the                            procedure.                           After obtaining informed consent, the colonoscope                            was passed under direct vision. Throughout the    procedure, the patient's blood pressure, pulse, and                            oxygen saturations were monitored continuously. The                            [id number]) was introduced through the                            anus and advanced to the the cecum, identified by                            appendiceal orifice and ileocecal valve. The                            colonoscopy was somewhat difficult due to a                            tortuous colon. The patient tolerated the procedure                            well. The quality of the bowel preparation was                            good. The ileocecal valve, appendiceal orifice, and                            rectum were photographed. Scope In: 8:33:11 AM Scope Out: 9:01:51 AM Scope Withdrawal Time: 0 hours 14 minutes 11 seconds  Total Procedure Duration: 0 hours 28 minutes 40 seconds  Findings:      The perianal and digital rectal examinations were normal.      Scattered small and large-mouthed diverticula were found in the sigmoid       colon, hepatic flexure and ascending colon.      A diminutive polyp was found in the appendiceal orifice. The polyp was       sessile. Biopsies were taken with a cold forceps for histology. The       pathology specimen was placed into Bottle Number 1.      A 6 mm polyp was found in the proximal transverse colon. The polyp was       sessile. The polyp was removed with a cold snare. Resection  was       complete, but the polyp tissue was only partially retrieved. The       pathology specimen was placed into Bottle Number 2.      External hemorrhoids were found during retroflexion. The hemorrhoids       were small. Impression:               - Diverticulosis in the sigmoid colon, at the                            hepatic flexure and in the ascending colon.                           - One diminutive polyp at the appendiceal orifice.                            Biopsied.                            - One 6 mm polyp in the proximal transverse colon,                            removed with a cold snare. Complete resection.                            Partial retrieval.                           - External hemorrhoids. Moderate Sedation:      Moderate (conscious) sedation was administered by the endoscopy nurse       and supervised by the endoscopist. The following parameters were       monitored: oxygen saturation, heart rate, blood pressure, CO2       capnography and response to care. Total physician intraservice time was       34 minutes. Recommendation:           - Patient has a contact number available for                            emergencies. The signs and symptoms of potential                            delayed complications were discussed with the                            patient. Return to normal activities tomorrow.                            Written discharge instructions were provided to the                            patient.                           - High fiber diet and diabetic (ADA) diet today.                           -  Continue present medications.                           - No aspirin, ibuprofen, naproxen, or other                            non-steroidal anti-inflammatory drugs for 1 day.                           - Await pathology results.                           - Repeat colonoscopy is recommended. The                            colonoscopy date will be determined after pathology                            results from today's exam become available for                            review. Procedure Code(s):        --- Professional ---                           [id number], Colonoscopy, flexible; with removal of                            tumor(s), polyp(s), or other lesion(s) by snare                            technique                           45380, 59, Colonoscopy, flexible; with biopsy,                            single or multiple                            99153, Moderate sedation; each additional 15                            minutes intraservice time                           G0500, Moderate sedation services provided by the                            same physician or other qualified health care                            professional performing a gastrointestinal                            endoscopic service that sedation supports,  requiring the presence of an independent trained                            observer to assist in the monitoring of the                            patient's level of consciousness and physiological                            status; initial 15 minutes of intra-service time;                            patient age 72 years or older (additional time may                            be reported with [phone], as appropriate) Diagnosis Code(s):        --- Professional ---                           Z12.11, Encounter for screening for malignant                            neoplasm of colon                           K64.4, Residual hemorrhoidal skin tags                           K63.5, Polyp of colon                           K57.30, Diverticulosis of large intestine without                            perforation or abscess without bleeding CPT copyright 2019 [employer]. All rights reserved. The codes documented in this report are preliminary and upon coder review may  be revised to meet current compliance requirements. Hildred Laser, MD Hildred Laser, MD 11/14/2018 9:13:30 AM This report has been signed electronically. Number of Addenda: 0

## 2018-11-14 NOTE — H&P (Signed)
Terri Miranda is an 73 y.o. female.   Chief Complaint: Patient is here for colonoscopy. HPI: Patient is 73 year old Afro-American female who is here for screening colonoscopy.  She denies abdominal pain change in bowel habits or rectal bleeding.  Last exam was 10 years ago and no polyps were found. Family history is negative for CRC.  Past Medical History:  Diagnosis Date  . Essential hypertension   . Mitral valve prolapse   . Mixed hyperlipidemia   . Palpitations   . Pre-diabetes     Past Surgical History:  Procedure Laterality Date  . BREAST EXCISIONAL BIOPSY Right     Family History  Problem Relation Age of Onset  . Hypertension Mother   . CVA Mother   . Coronary artery disease Father   . Prostate cancer Father   . Hypertension Sister   . Coronary artery disease Sister   . Breast cancer Maternal Aunt    Social History:  reports that she has never smoked. She has never used smokeless tobacco. She reports current alcohol use. No history on file for drug.  Allergies:  Allergies  Allergen Reactions  . Shellfish Allergy Anaphylaxis and Hives  . Lovastatin     myalgias    Medications Prior to Admission  Medication Sig Dispense Refill  . acetaminophen (TYLENOL) 500 MG tablet Take 500 mg by mouth every 6 (six) hours as needed for moderate pain or headache.    Marland Kitchen aspirin EC 81 MG tablet Take 81 mg by mouth daily.    . calcium carbonate (OS-CAL) 600 MG TABS Take 600 mg by mouth every other day.     . Carboxymethylcellul-Glycerin (LUBRICATING EYE DROPS OP) Place 1 drop into both eyes daily as needed (dry eyes).    . cholecalciferol (VITAMIN D3) 25 MCG (1000 UT) tablet Take 1,000 Units by mouth daily.    . diclofenac sodium (VOLTAREN) 1 % GEL Apply 2 g topically 2 (two) times daily as needed (knee pain).     . enalapril (VASOTEC) 10 MG tablet Take 10 mg by mouth every evening.    . fish oil-omega-3 fatty acids 1000 MG capsule Take 1 g by mouth daily.    . hydrochlorothiazide  (HYDRODIURIL) 25 MG tablet Take 25 mg by mouth daily.    Marland Kitchen loratadine (CLARITIN) 10 MG tablet Take 10 mg by mouth daily as needed for allergies.    . metFORMIN (GLUCOPHAGE-XR) 500 MG 24 hr tablet Take 500 mg by mouth at bedtime.     . Multiple Vitamin (MULTIVITAMIN WITH MINERALS) TABS Take 1 tablet by mouth every other day.     Marland Kitchen oxymetazoline (NASAL SPRAY 12 HOUR) 0.05 % nasal spray Place 2 sprays into the nose daily as needed for congestion.     . pravastatin (PRAVACHOL) 20 MG tablet Take 20 mg by mouth every evening.      Results for orders placed or performed during the hospital encounter of 11/14/18 (from the past 48 hour(s))  Glucose, capillary     Status: Abnormal   Collection Time: 11/14/18  7:49 AM  Result Value Ref Range   Glucose-Capillary 113 (H) 70 - 99 mg/dL   No results found.  ROS  Blood pressure (!) 153/78, pulse 82, temperature 98.7 F (37.1 C), temperature source Oral, resp. rate 16, height 5' 7.5" (1.715 m), weight 93 kg, SpO2 99 %. Physical Exam  Constitutional: She appears well-developed and well-nourished.  HENT:  Mouth/Throat: Oropharynx is clear and moist.  Eyes: Conjunctivae are normal. No  scleral icterus.  Neck: No thyromegaly present.  Cardiovascular: Normal rate, regular rhythm and normal heart sounds.  No murmur heard. Respiratory: Effort normal and breath sounds normal.  GI: Soft. She exhibits no distension and no mass. There is no abdominal tenderness.  Musculoskeletal:        General: No edema.  Neurological: She is alert.  Skin: Skin is warm and dry.     Assessment/Plan Average risk screening colonoscopy.  Hildred Laser, MD 11/14/2018, 8:24 AM

## 2018-11-15 ENCOUNTER — Other Ambulatory Visit: Payer: Self-pay | Admitting: Internal Medicine

## 2018-11-15 DIAGNOSIS — Z1231 Encounter for screening mammogram for malignant neoplasm of breast: Secondary | ICD-10-CM

## 2018-11-16 ENCOUNTER — Encounter (HOSPITAL_COMMUNITY): Payer: Self-pay | Admitting: Internal Medicine

## 2018-12-12 DIAGNOSIS — H524 Presbyopia: Secondary | ICD-10-CM | POA: Diagnosis not present

## 2018-12-12 DIAGNOSIS — H5203 Hypermetropia, bilateral: Secondary | ICD-10-CM | POA: Diagnosis not present

## 2018-12-12 DIAGNOSIS — H2513 Age-related nuclear cataract, bilateral: Secondary | ICD-10-CM | POA: Diagnosis not present

## 2018-12-12 DIAGNOSIS — E119 Type 2 diabetes mellitus without complications: Secondary | ICD-10-CM | POA: Diagnosis not present

## 2018-12-12 DIAGNOSIS — H04123 Dry eye syndrome of bilateral lacrimal glands: Secondary | ICD-10-CM | POA: Diagnosis not present

## 2018-12-20 DIAGNOSIS — E785 Hyperlipidemia, unspecified: Secondary | ICD-10-CM | POA: Diagnosis not present

## 2018-12-20 DIAGNOSIS — Z23 Encounter for immunization: Secondary | ICD-10-CM | POA: Diagnosis not present

## 2018-12-20 DIAGNOSIS — E669 Obesity, unspecified: Secondary | ICD-10-CM | POA: Diagnosis not present

## 2018-12-20 DIAGNOSIS — Z1389 Encounter for screening for other disorder: Secondary | ICD-10-CM | POA: Diagnosis not present

## 2018-12-20 DIAGNOSIS — E559 Vitamin D deficiency, unspecified: Secondary | ICD-10-CM | POA: Diagnosis not present

## 2018-12-20 DIAGNOSIS — I10 Essential (primary) hypertension: Secondary | ICD-10-CM | POA: Diagnosis not present

## 2018-12-20 DIAGNOSIS — R0683 Snoring: Secondary | ICD-10-CM | POA: Diagnosis not present

## 2018-12-20 DIAGNOSIS — R7303 Prediabetes: Secondary | ICD-10-CM | POA: Diagnosis not present

## 2018-12-20 DIAGNOSIS — I8393 Asymptomatic varicose veins of bilateral lower extremities: Secondary | ICD-10-CM | POA: Diagnosis not present

## 2018-12-20 DIAGNOSIS — Z0000 Encounter for general adult medical examination without abnormal findings: Secondary | ICD-10-CM | POA: Diagnosis not present

## 2018-12-31 ENCOUNTER — Other Ambulatory Visit: Payer: Self-pay

## 2018-12-31 ENCOUNTER — Ambulatory Visit
Admission: RE | Admit: 2018-12-31 | Discharge: 2018-12-31 | Disposition: A | Discharge status: Home / Self Care | Payer: Medicare HMO | Source: Ambulatory Visit | Attending: Internal Medicine | Admitting: Internal Medicine

## 2018-12-31 DIAGNOSIS — Z1231 Encounter for screening mammogram for malignant neoplasm of breast: Secondary | ICD-10-CM | POA: Diagnosis not present

## 2019-01-17 DIAGNOSIS — M1711 Unilateral primary osteoarthritis, right knee: Secondary | ICD-10-CM | POA: Diagnosis not present

## 2019-06-21 DIAGNOSIS — I10 Essential (primary) hypertension: Secondary | ICD-10-CM | POA: Diagnosis not present

## 2019-06-21 DIAGNOSIS — E785 Hyperlipidemia, unspecified: Secondary | ICD-10-CM | POA: Diagnosis not present

## 2019-06-21 DIAGNOSIS — E1169 Type 2 diabetes mellitus with other specified complication: Secondary | ICD-10-CM | POA: Diagnosis not present

## 2019-06-21 DIAGNOSIS — N183 Chronic kidney disease, stage 3 unspecified: Secondary | ICD-10-CM | POA: Diagnosis not present

## 2019-06-21 DIAGNOSIS — R69 Illness, unspecified: Secondary | ICD-10-CM | POA: Diagnosis not present

## 2019-06-23 DIAGNOSIS — R69 Illness, unspecified: Secondary | ICD-10-CM | POA: Diagnosis not present

## 2019-06-24 DIAGNOSIS — R69 Illness, unspecified: Secondary | ICD-10-CM | POA: Diagnosis not present

## 2019-06-25 DIAGNOSIS — R69 Illness, unspecified: Secondary | ICD-10-CM | POA: Diagnosis not present

## 2019-06-27 DIAGNOSIS — R69 Illness, unspecified: Secondary | ICD-10-CM | POA: Diagnosis not present

## 2019-07-02 ENCOUNTER — Telehealth (INDEPENDENT_AMBULATORY_CARE_PROVIDER_SITE_OTHER): Payer: Self-pay | Admitting: Internal Medicine

## 2019-07-02 NOTE — Telephone Encounter (Signed)
Patient called the office wanting to know what she could get OTC for hemorrhoids - please advise - [phone]

## 2019-07-03 DIAGNOSIS — I10 Essential (primary) hypertension: Secondary | ICD-10-CM | POA: Diagnosis not present

## 2019-07-03 DIAGNOSIS — N183 Chronic kidney disease, stage 3 unspecified: Secondary | ICD-10-CM | POA: Diagnosis not present

## 2019-07-03 DIAGNOSIS — K644 Residual hemorrhoidal skin tags: Secondary | ICD-10-CM | POA: Diagnosis not present

## 2019-07-03 DIAGNOSIS — M109 Gout, unspecified: Secondary | ICD-10-CM | POA: Diagnosis not present

## 2019-07-03 DIAGNOSIS — E790 Hyperuricemia without signs of inflammatory arthritis and tophaceous disease: Secondary | ICD-10-CM | POA: Diagnosis not present

## 2019-07-03 NOTE — Telephone Encounter (Signed)
Please have patient contact their PCP , to address this issue.. As Dr.Rehman is out of the office , and his PA is on [place].

## 2019-07-16 ENCOUNTER — Other Ambulatory Visit: Payer: Self-pay | Admitting: *Deleted

## 2019-07-16 ENCOUNTER — Other Ambulatory Visit: Payer: Self-pay

## 2019-07-16 NOTE — Patient Outreach (Signed)
[place] [facility]) Care Management  07/16/2019  LYRAE MESNARD [birth year] [id number]  Unsuccessful outreach attempt made to patient for screening. RN Health Coach left HIPAA compliant voicemail message along with her contact information.  Plan: RN Health Coach will call patient within the next few business days and will send patient an unsuccessful letter.   [name] RN, BSN [place] [phone] [email or web address]

## 2019-07-18 ENCOUNTER — Encounter: Payer: Self-pay | Admitting: *Deleted

## 2019-07-18 ENCOUNTER — Other Ambulatory Visit: Payer: Self-pay | Admitting: *Deleted

## 2019-07-18 NOTE — Patient Outreach (Signed)
[place] [facility]) Care Management  Terri Miranda  07/18/2019   Terri Miranda 1945-11-24 [id number]   Successful telephone call to patient. HIPAA identifiers obtained. Consent: [facility] services reviewed and discussed with patient. Patient verbally agrees to participate in the [facility] program and to disease management outreaches.  Social: Patient lives with her husband. She is independent with all ADLs , IADLs, and currently transports herself to all of her medical appointments. Patient denies having any recent falls and reports that her home environment is safe. Her husband Terri Miranda would be able to provide care for the patient if she were to need assistance and emotionally the patient is doing well at this time.  Subjective: Patient reports that she is doing good. She feels that her diabetes is well controlled at this time and would like to focus on improving her hypertension. Patient states that currently she takes her blood pressure around 4-5 times weekly. Her values range from 123456 to 123456 systolic and typically in the [id number] for diastolic. [name] explains that she does try to limit her salt intake but this can be difficult due to how much salt is in foods. She currently is not doing much physical activity but explains that she has a goal of increasing her exercise to twice weekly. She comments that she does want to lose weight to help decrease her B/P, blood sugar, the pain in her knees, and to become healthier overall. The patient did not have any other concerns at this time.  Interventions: Nurse completed the initial assessment and provided education regarding hypertension.   Encounter Medications:  Outpatient Encounter Medications as of 07/18/2019  Medication Sig  . acetaminophen (TYLENOL) 500 MG tablet Take 500 mg by mouth every 6 (six) hours as needed for moderate pain or headache.  . allopurinol (ZYLOPRIM) 100 MG tablet Take 100 mg by mouth daily.  Marland Kitchen aspirin EC 81 MG tablet  Take 1 tablet (81 mg total) by mouth daily.  . calcium carbonate (OS-CAL) 600 MG TABS Take 600 mg by mouth every other day.   . Carboxymethylcellul-Glycerin (LUBRICATING EYE DROPS OP) Place 1 drop into both eyes daily as needed (dry eyes).  . cholecalciferol (VITAMIN D3) 25 MCG (1000 UT) tablet Take 1,000 Units by mouth daily.  . diclofenac sodium (VOLTAREN) 1 % GEL Apply 2 g topically 2 (two) times daily as needed (knee pain).   . enalapril (VASOTEC) 10 MG tablet Take 10 mg by mouth every evening.  . fish oil-omega-3 fatty acids 1000 MG capsule Take 1 g by mouth daily.  . hydrochlorothiazide (HYDRODIURIL) 25 MG tablet Take 25 mg by mouth daily.  Marland Kitchen loratadine (CLARITIN) 10 MG tablet Take 10 mg by mouth daily as needed for allergies.  . metFORMIN (GLUCOPHAGE-XR) 500 MG 24 hr tablet Take 500 mg by mouth at bedtime.   . Multiple Vitamin (MULTIVITAMIN WITH MINERALS) TABS Take 1 tablet by mouth every other day.   Marland Kitchen oxymetazoline (NASAL SPRAY 12 HOUR) 0.05 % nasal spray Place 2 sprays into the nose daily as needed for congestion.   . pravastatin (PRAVACHOL) 20 MG tablet Take 20 mg by mouth every evening.   No facility-administered encounter medications on file as of 07/18/2019.    Functional Status:  In your present state of health, do you have any difficulty performing the following activities: 07/18/2019  Hearing? N  Vision? N  Difficulty concentrating or making decisions? N  Walking or climbing stairs? N  Dressing or bathing? N  Doing errands,  shopping? N  Preparing Food and eating ? N  Using the Toilet? N  In the past six months, have you accidently leaked urine? N  Do you have problems with loss of bowel control? N  Managing your Medications? N  Managing your Finances? N  Housekeeping or managing your Housekeeping? N  Some recent data might be hidden    Fall/Depression Screening: Fall Risk  07/18/2019  Falls in the past year? 1  Number falls in past yr: 0  Injury with Fall? 0  Risk  for fall due to : History of fall(s)  Follow up Falls prevention discussed;Education provided;Falls evaluation completed   PHQ 2/9 Scores 07/18/2019  PHQ - 2 Score 0   THN CM Care Plan Problem One     Most Recent Value  Care Plan Problem One  Knowledge deficient related to hypertension condition, treatment plan, and lifestyle changes as evidenced by request for education informaiton  Role Documenting the Problem One  [place] for Problem One  Active  [facility] Long Term Goal   Patient will begin to take her blood pressure daily and record values within 90 days  THN Long Term Goal Start Date  07/18/19  Interventions for Problem One Long Term Goal  RN discussed the importance of monitoring B/P daily and recording values, encouraged medication and low sodium diet adherence, sent a matter of choices blood pressure control booklet, sent EMMI weight loss tips education, sent [facility] calendar booklet to record B/P values.  THN CM Short Term Goal #1   Patient will start to exercise x2 weekly within the next 30 days  THN CM Short Term Goal #1 Start Date  07/18/19  Interventions for Short Term Goal #1  RN provided education about how exercise lowers B/P, blood sugar, and helps to maintain strength, Provided [employer] television exercise program resource for seniors, encouraged patient to increase her physical activity by walking and by being active when maintaining her home.     Plan: RN Health Coach will send introductory letters to provider and patient, will send hypertension education and EMMI weight loss tips to patient, and will send calendar booklet and life alert brochure to patient. RN Health Coach will call patient within the month of June and patient agrees to future outreach calls.   [name] RN, BSN [place] [phone] [email or web address]

## 2019-08-05 DIAGNOSIS — E1169 Type 2 diabetes mellitus with other specified complication: Secondary | ICD-10-CM | POA: Diagnosis not present

## 2019-08-05 DIAGNOSIS — N183 Chronic kidney disease, stage 3 unspecified: Secondary | ICD-10-CM | POA: Diagnosis not present

## 2019-08-05 DIAGNOSIS — E669 Obesity, unspecified: Secondary | ICD-10-CM | POA: Diagnosis not present

## 2019-08-05 DIAGNOSIS — R0683 Snoring: Secondary | ICD-10-CM | POA: Diagnosis not present

## 2019-08-05 DIAGNOSIS — G4719 Other hypersomnia: Secondary | ICD-10-CM | POA: Diagnosis not present

## 2019-08-05 DIAGNOSIS — I10 Essential (primary) hypertension: Secondary | ICD-10-CM | POA: Diagnosis not present

## 2019-08-08 ENCOUNTER — Encounter: Payer: Medicare HMO | Admitting: Dietician

## 2019-08-21 ENCOUNTER — Other Ambulatory Visit: Payer: Self-pay | Admitting: *Deleted

## 2019-08-21 NOTE — Patient Outreach (Signed)
[place] [facility]) Care Management  08/21/2019  Terri Miranda [birth year] [id number]  Outreach attempt to patient. No answer and unable to leave voicemail message due to the phone rang numerous times and then voice mailbox asked for an access code and did not give opportunity to leave a voice message.  Plan: RN Health Coach will call patient within the month of July and patient agrees to future outreach calls.   [name] RN, BSN [place] [phone] [name]

## 2019-09-26 ENCOUNTER — Other Ambulatory Visit: Payer: Self-pay | Admitting: *Deleted

## 2019-09-26 NOTE — Patient Outreach (Addendum)
[place] [facility]) Care Management  09/26/2019  REN GRASSE [birth year] [id number]  Unsuccessful outreach attempt made to patient. Patient answered the phone and stated [place] and could not speak to nurse. Patient asked the nurse to call her back tomorrow.   Plan: RN Health Coach will call patient 09/27/19.  [name] RN, BSN [place] [phone] [email or web address]

## 2019-09-27 ENCOUNTER — Other Ambulatory Visit: Payer: Self-pay | Admitting: *Deleted

## 2019-09-27 NOTE — Patient Outreach (Signed)
[place] [facility]) Care Management  [place]  09/27/2019   Terri Miranda 1945-07-14 [id number]  Subjective: Successful telephone outreach call to patient. HIPAA identifiers obtained. Patient  States she is doing well. She reports that she fell about a week ago due to wearing flip flop shoes and slipping because it was wet from the rain. Patient denies any injury from the fall. 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 She is adhering to a low sodium diet and is eating healthy. Her B/P today was 130/78 and patient reports that her values have been lower overall. She currently has been taking her B/P several times weekly but states that she will start to take it and her blood sugar daily to stay on top of her health and wellness. Her last FBS was 127. Patient asked about discontinuing her allopurinol medication. Nurse advised that she would have to ask her PCP about stopping any medication and provided education regarding low purine diet. Patient did not have any further questions or concerns and did take nurse's contact number to call her is needed.   Encounter Medications:  Outpatient Encounter Medications as of 09/27/2019  Medication Sig Note  . acetaminophen (TYLENOL) 500 MG tablet Take 500 mg by mouth every 6 (six) hours as needed for moderate pain or headache.   . allopurinol (ZYLOPRIM) 100 MG tablet Take 100 mg by mouth daily.   Marland Kitchen aspirin EC 81 MG tablet Take 1 tablet (81 mg total) by mouth daily.   . Carboxymethylcellul-Glycerin (LUBRICATING EYE DROPS OP) Place 1 drop into both eyes daily as needed (dry eyes).   . cholecalciferol (VITAMIN D3) 25 MCG (1000 UT) tablet Take 1,000 Units by mouth daily.   . diclofenac sodium (VOLTAREN) 1 % GEL Apply 2 g topically 2 (two) times daily as needed (knee pain).    . enalapril (VASOTEC) 10 MG tablet Take 10 mg by mouth every evening.   . fish oil-omega-3  fatty acids 1000 MG capsule Take 1 g by mouth daily.   . hydrochlorothiazide (HYDRODIURIL) 25 MG tablet Take 25 mg by mouth daily.   Marland Kitchen loratadine (CLARITIN) 10 MG tablet Take 10 mg by mouth daily as needed for allergies.   . metFORMIN (GLUCOPHAGE-XR) 500 MG 24 hr tablet Take 500 mg by mouth at bedtime.    . Multiple Vitamin (MULTIVITAMIN WITH MINERALS) TABS Take 1 tablet by mouth every other day.    Marland Kitchen oxymetazoline (NASAL SPRAY 12 HOUR) 0.05 % nasal spray Place 2 sprays into the nose daily as needed for congestion.    . pravastatin (PRAVACHOL) 20 MG tablet Take 20 mg by mouth every evening.   . calcium carbonate (OS-CAL) 600 MG TABS Take 600 mg by mouth every other day.  (Patient not taking: Reported on 09/27/2019) 09/27/2019: Patient states she is not taking this anymore   No facility-administered encounter medications on file as of 09/27/2019.    Functional Status:  In your present state of health, do you have any difficulty performing the following activities: 07/18/2019  Hearing? N  Vision? N  Difficulty concentrating or making decisions? N  Walking or climbing stairs? N  Dressing or bathing? N  Doing errands, shopping? N  Preparing Food and eating ? N  Using the Toilet? N  In the past six months, have you accidently leaked urine? N  Do you have problems with loss of bowel control? N  Managing your Medications? N  Managing your Finances? N  Housekeeping or managing your Housekeeping? N  Some recent data might be hidden    Fall/Depression Screening: Fall Risk  09/27/2019 07/18/2019  Falls in the past year? 1 1  Number falls in past yr: 1 0  Injury with Fall? 0 0  Risk for fall due to : (No Data) History of fall(s)  Risk for fall due to: Comment 2 falls per pt. both accidents and she typically feels steady on her feet -  Follow up Falls prevention discussed;Education provided;Falls evaluation completed Falls prevention discussed;Education provided;Falls evaluation completed   PHQ 2/9  Scores 07/18/2019  PHQ - 2 Score 0   Goals Addressed            This Visit's Progress   . Patient will verbalized B/P  maintained < 140/90 within the next 90 days       [place] (see longtitudinal plan of care for additional care plan information)  Objective:  . Last practice recorded BP readings:  BP Readings from Last 3 Encounters:  11/14/18 (!) 102/48  09/25/18 (!) 142/73  04/10/18 130/89 .   Marland Kitchen Most recent eGFR/CrCl: No results found for: EGFR  No components found for: CRCL  Current Barriers:  Marland Kitchen Knowledge deficit related to self care management of hypertension  Case Manager Clinical Goal(s):  Marland Kitchen Over the next 90 days, patient will verbalize understanding of plan for hypertension management . Over the next 90 days, patient will attend all scheduled medical appointments:   . Over the next 90 days, patient will demonstrate improved adherence to prescribed treatment plan for hypertension as evidenced by taking all medications as prescribed, monitoring and recording blood pressure as directed, adhering to low sodium/DASH diet . Over the next 90 days, patient will verbalize basic understanding of hypertension disease process and self health management plan as evidenced by patient increasing the amount of physical activity she does and by starting to take her B/P daily and recording values.  Interventions:  . Evaluation of current treatment plan related to hypertension self management and patient's adherence to plan as established by provider. . Reviewed medications with patient and discussed importance of compliance . Advised patient, providing education and rationale, to monitor blood pressure daily and record, calling PCP for findings outside established parameters.  . Provided education regarding s/s of DASH diet/Low salt diet . Provided education regarding increasing physical activity  Patient Self Care Activities:  . Self administers medications as prescribed . Attends  all scheduled provider appointments . Adheres to a low sodium diet/DASH diet . Increase physical activity as tolerated . Patient states that she will begin to take her B/P daily and record values.           Plan: RN Health Coach will send patient education regarding low purine diet, will call patient within the month of September, and patient agrees to future outreach calls.   [name] RN, BSN [place] [phone] Terri [email or web address]

## 2019-10-20 IMAGING — CT CT HEAD WITHOUT CONTRAST
3 series · 15 of 46 positions shown, 18 images · non-contrast
Comparison: None.

CLINICAL DATA: Dizziness since this morning

EXAM:
CT HEAD WITHOUT CONTRAST
TECHNIQUE: Contiguous axial images were obtained from the base of the skull
through the vertex without intravenous contrast.

[Series 2: head w o · axial · 0.46mm/px · z∈[+67,+187]mm · 9 of 29 slices shown, 12 images]
[im 3/29  brain]
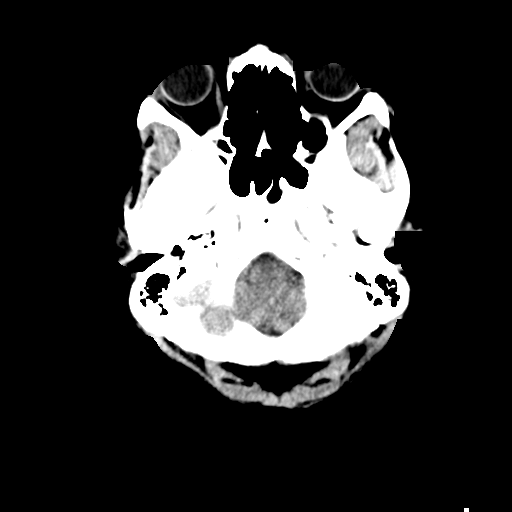
[im 3/29  bone]
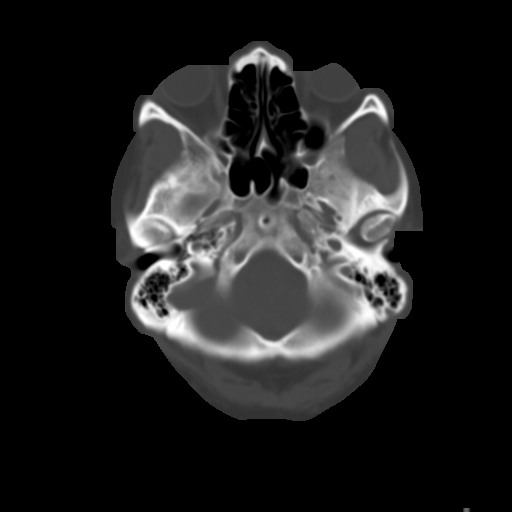
[im 6/29  brain]
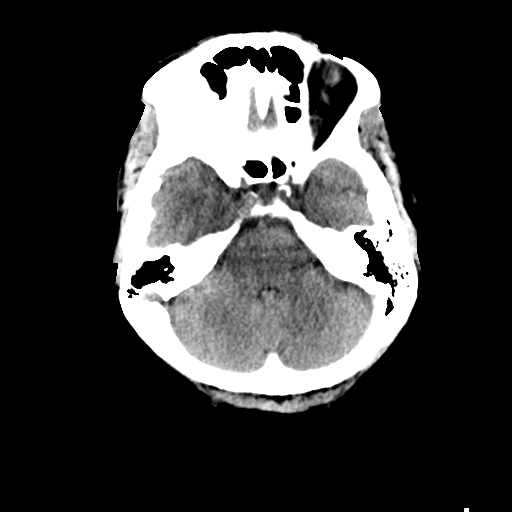
[im 9/29  brain]
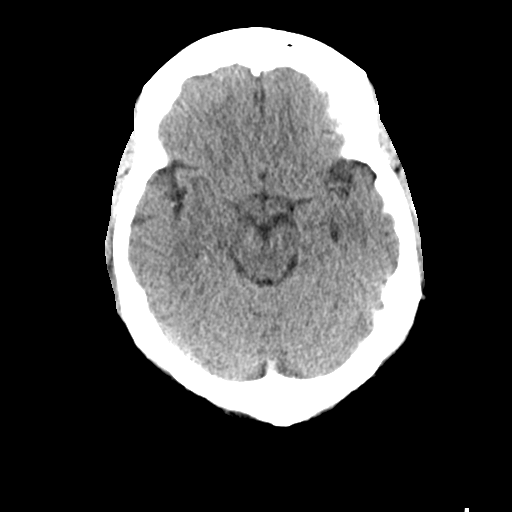
[im 12/29  brain]
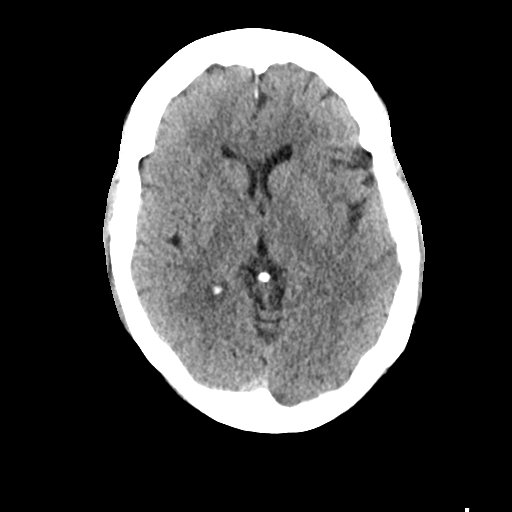
[im 15/29  brain]
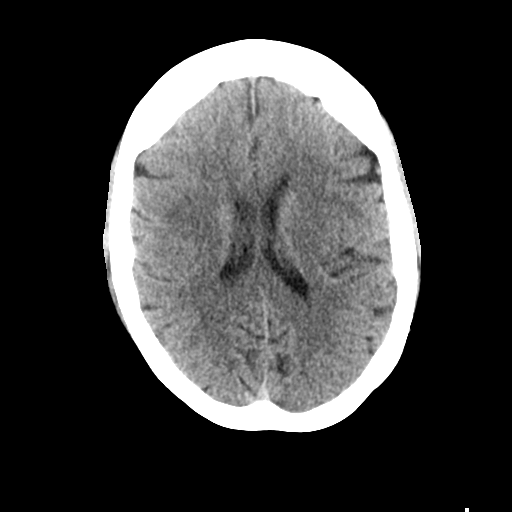
[im 15/29  bone]
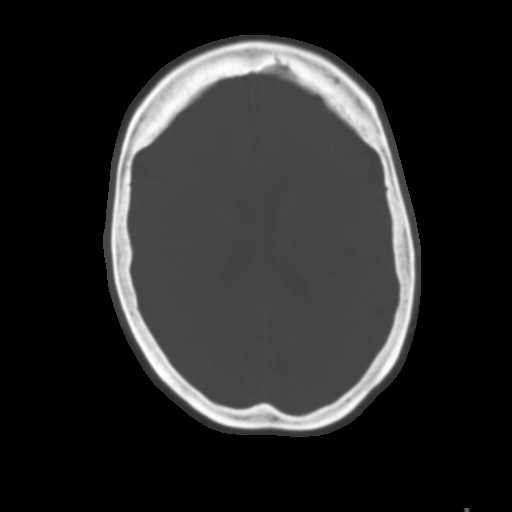
[im 18/29  brain]
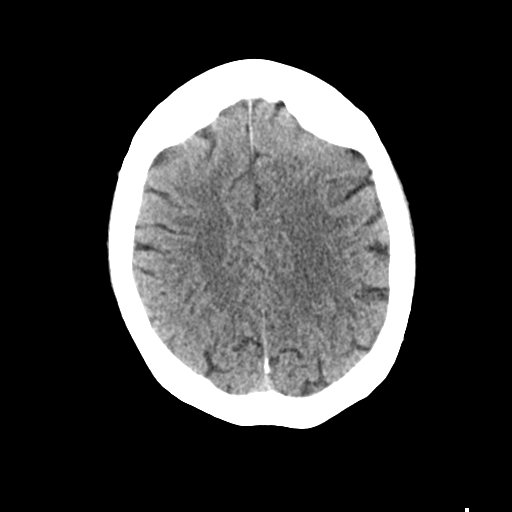
[im 21/29  brain]
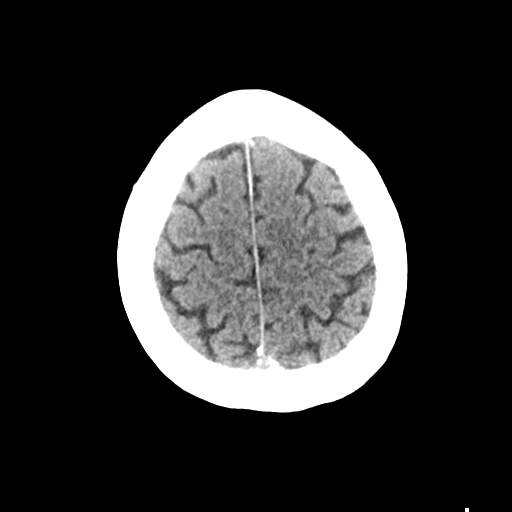
[im 24/29  brain]
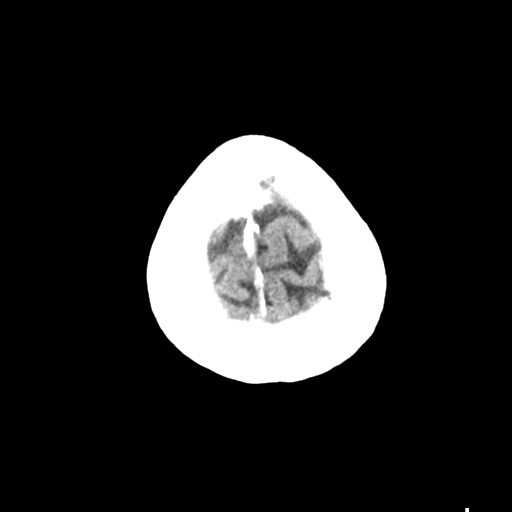
[im 27/29  brain]
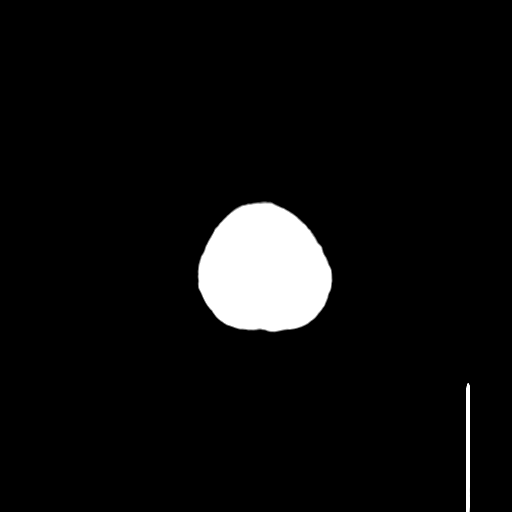
[im 27/29  bone]
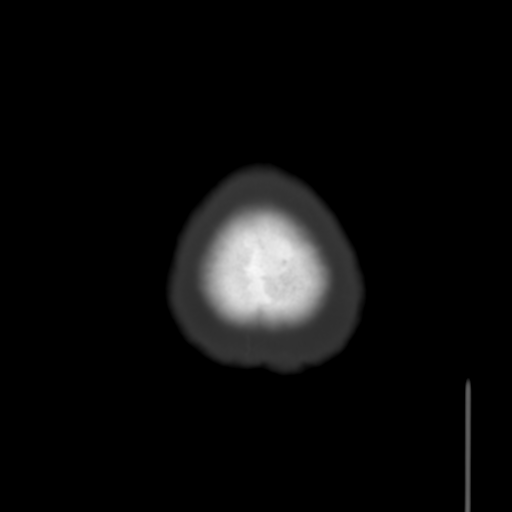

[Series 4: coronal soft · coronal · 0.32mm/px · 3 of 67 slices shown]
[im 23/67  brain]
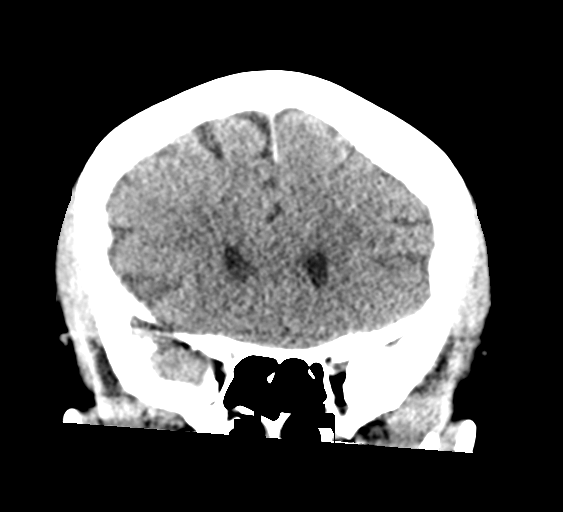
[im 30/67  brain]
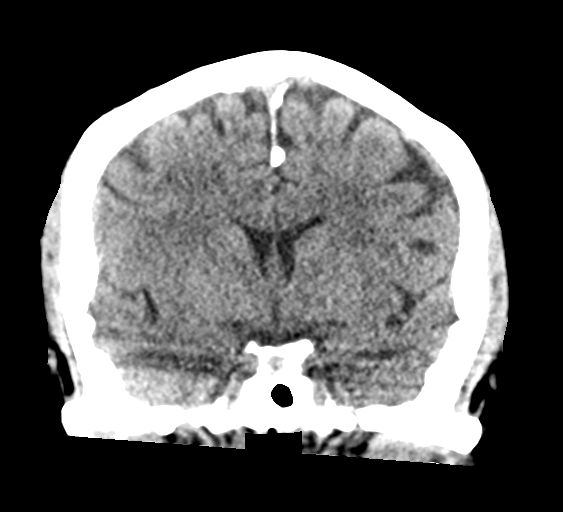
[im 37/67  brain]
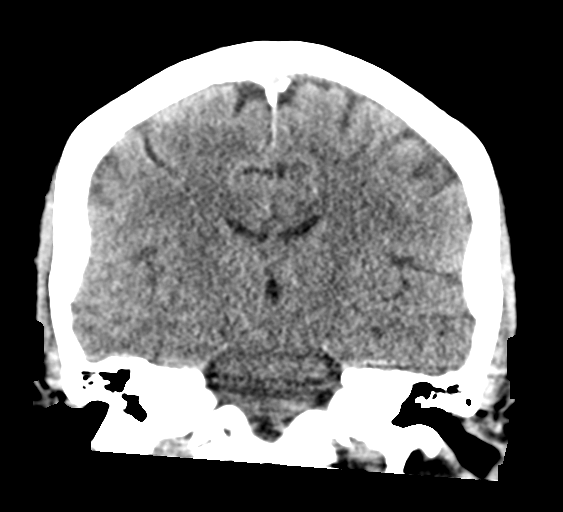

[Series 5: sagittal soft · sagittal · 0.30mm/px · 3 of 57 slices shown]
[im 19/57  brain]
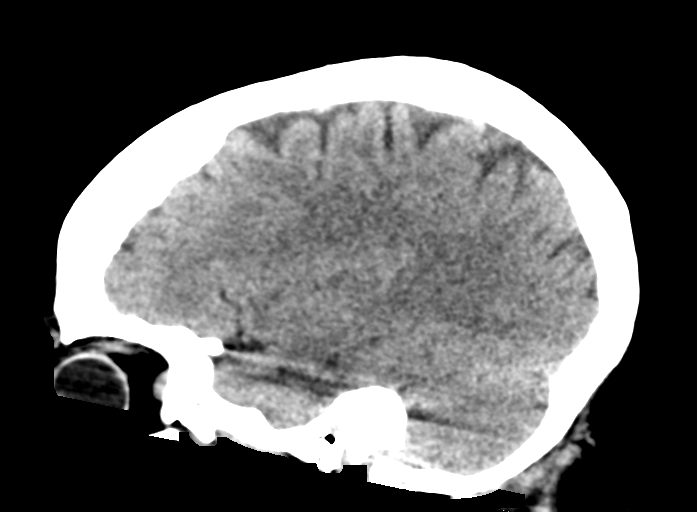
[im 29/57  brain]
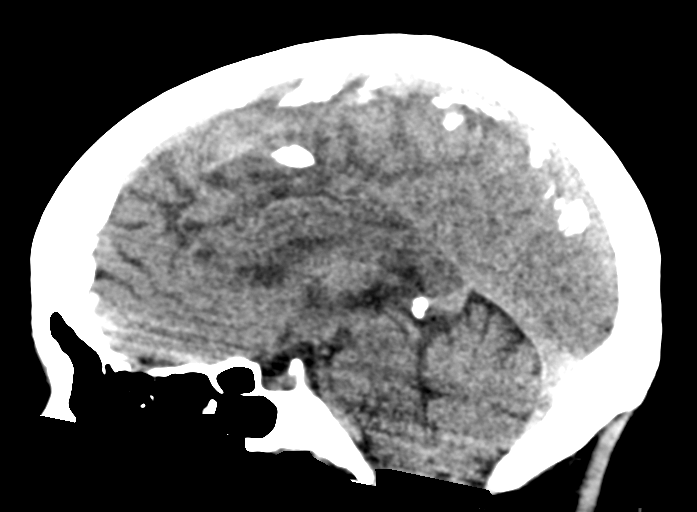
[im 38/57  brain]
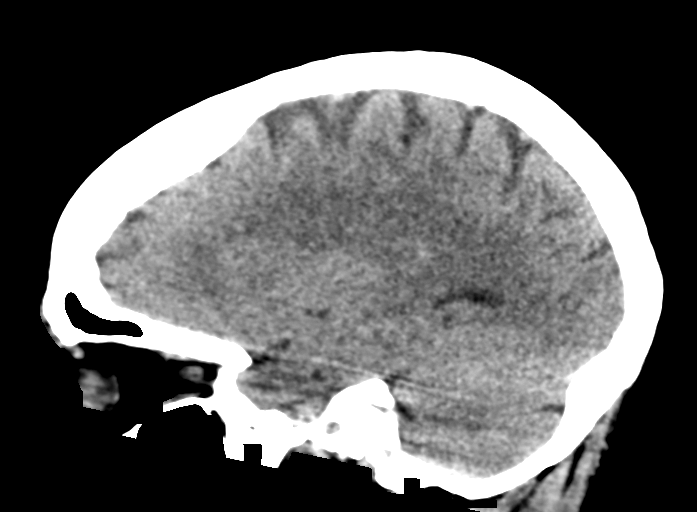

[15 of 46 positions shown; findings below may reference images not displayed]

FINDINGS: Brain: No evidence of acute infarction, hemorrhage, hydrocephalus,
extra-axial collection or mass lesion/mass effect. Mild white matter
disease attributed to chronic small vessel ischemia.

Vascular: No hyperdense vessel or unexpected calcification.

Skull: Normal. Negative for fracture or focal lesion.

Sinuses/Orbits: Negative
IMPRESSION: No acute or reversible finding.

## 2019-10-20 IMAGING — CR PORTABLE CHEST - 1 VIEW
1 series · 2 of 2 positions shown · non-contrast
Comparison: None.

CLINICAL DATA: Shortness of breath and dizziness

EXAM:
PORTABLE CHEST 1 VIEW

[Series 1: portable · 0.17mm/px · 2 of 2 slices shown]
[im 1/2]
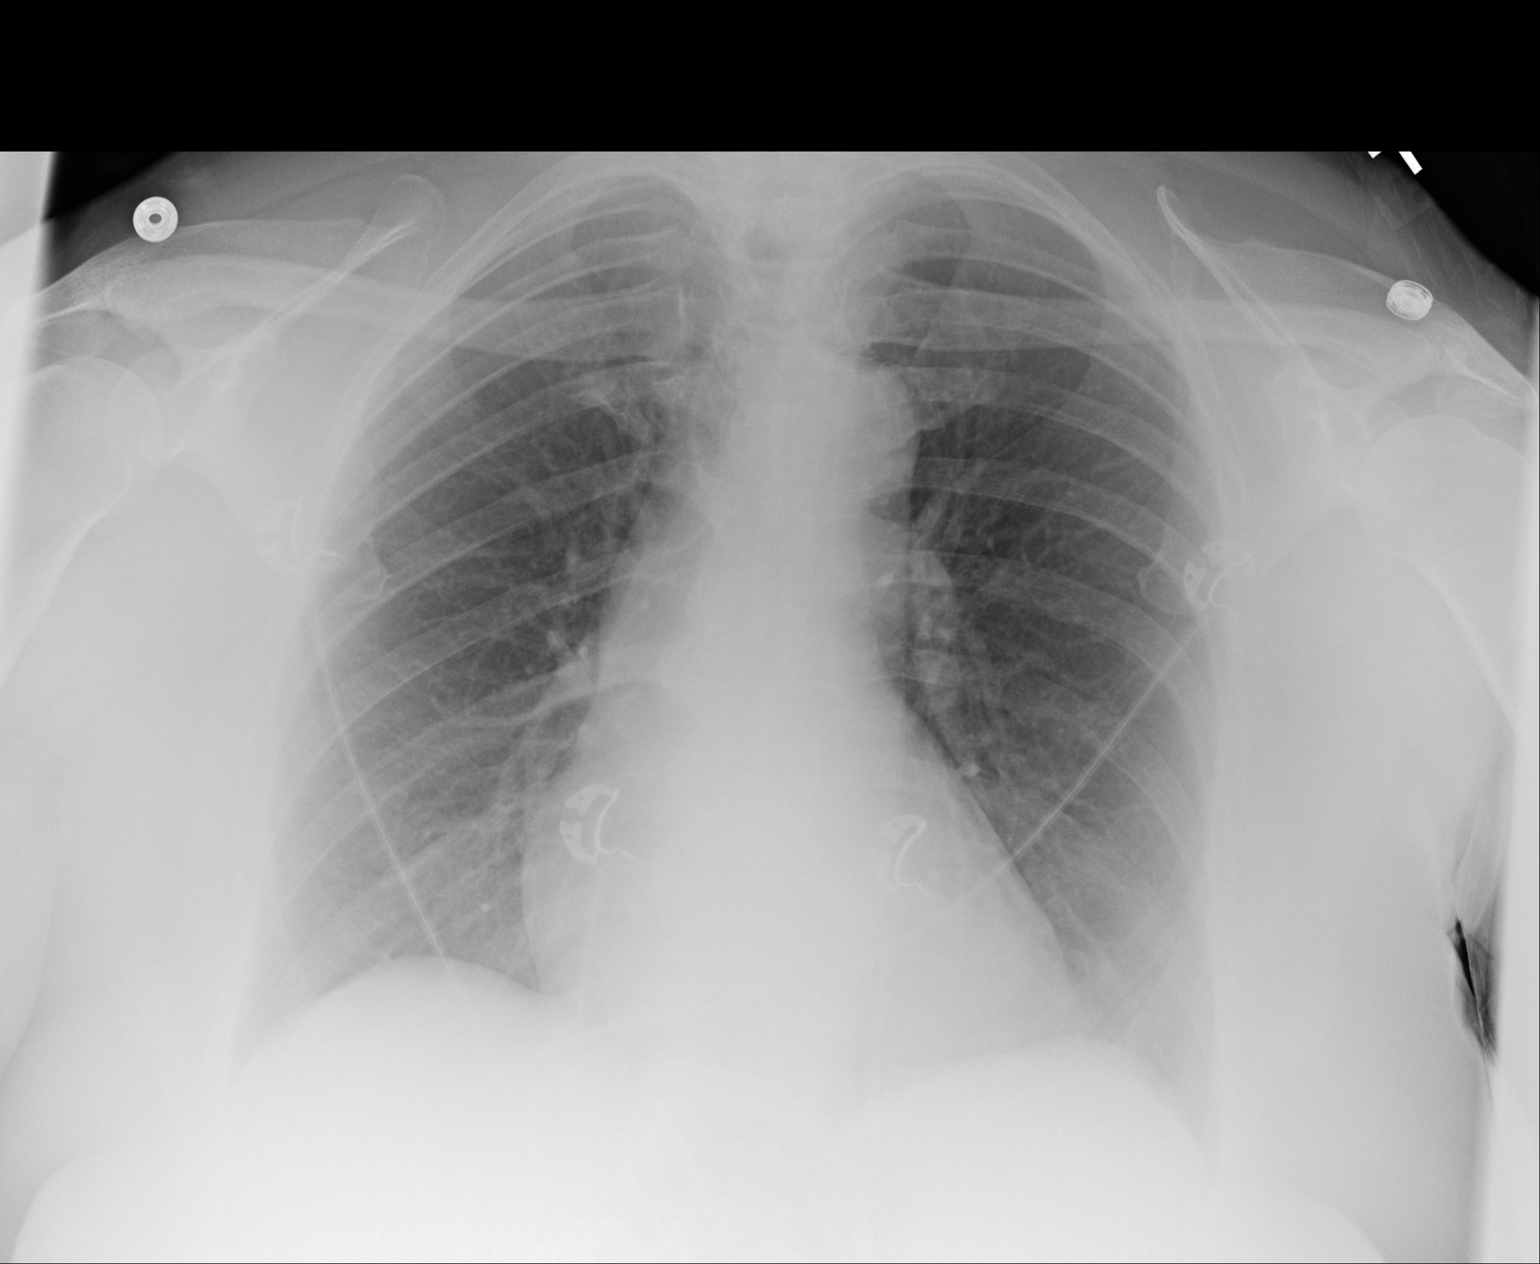
[im 2/2]
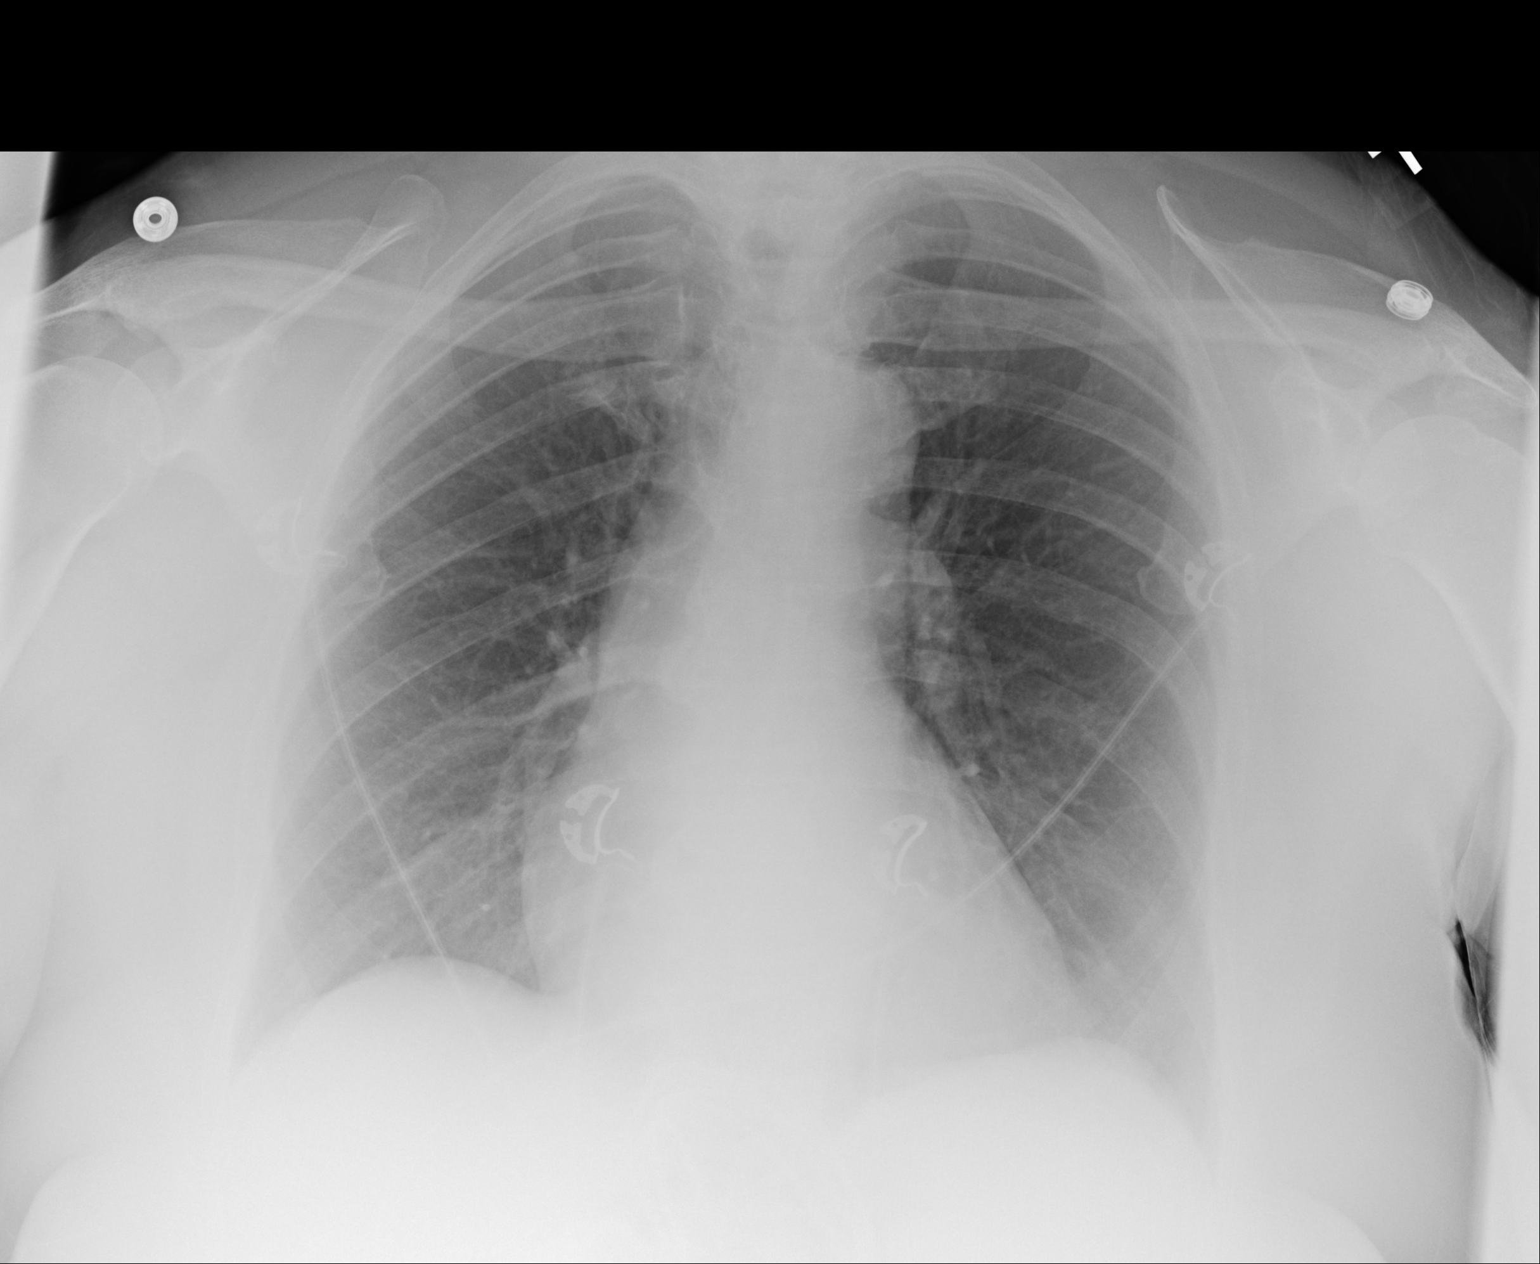

[2 of 2 positions shown; findings below may reference images not displayed]

FINDINGS: The heart size and mediastinal contours are within normal limits.
Both lungs are clear. The visualized skeletal structures are
unremarkable.
IMPRESSION: No active disease.

## 2019-11-07 ENCOUNTER — Encounter (INDEPENDENT_AMBULATORY_CARE_PROVIDER_SITE_OTHER): Payer: Self-pay | Admitting: *Deleted

## 2019-11-19 DIAGNOSIS — M170 Bilateral primary osteoarthritis of knee: Secondary | ICD-10-CM | POA: Diagnosis not present

## 2019-11-21 ENCOUNTER — Other Ambulatory Visit: Payer: Self-pay

## 2019-11-21 ENCOUNTER — Telehealth (INDEPENDENT_AMBULATORY_CARE_PROVIDER_SITE_OTHER): Payer: Self-pay | Admitting: *Deleted

## 2019-11-21 ENCOUNTER — Encounter (INDEPENDENT_AMBULATORY_CARE_PROVIDER_SITE_OTHER): Payer: Self-pay | Admitting: *Deleted

## 2019-11-21 ENCOUNTER — Other Ambulatory Visit (INDEPENDENT_AMBULATORY_CARE_PROVIDER_SITE_OTHER): Payer: Self-pay | Admitting: *Deleted

## 2019-11-21 ENCOUNTER — Encounter (INDEPENDENT_AMBULATORY_CARE_PROVIDER_SITE_OTHER): Payer: Self-pay | Admitting: Gastroenterology

## 2019-11-21 ENCOUNTER — Ambulatory Visit (INDEPENDENT_AMBULATORY_CARE_PROVIDER_SITE_OTHER): Payer: Medicare HMO | Admitting: Gastroenterology

## 2019-11-21 VITALS — BP 148/70 | HR 88 | Temp 99.0°F | Ht 67.0 in | Wt 210.0 lb

## 2019-11-21 DIAGNOSIS — K5909 Other constipation: Secondary | ICD-10-CM

## 2019-11-21 DIAGNOSIS — Z86010 Personal history of colonic polyps: Secondary | ICD-10-CM

## 2019-11-21 MED ORDER — LINACLOTIDE 72 MCG PO CAPS: 72.0000 ug | ORAL_CAPSULE | Freq: Every day | ORAL | 3 refills | Status: DC

## 2019-11-21 NOTE — Progress Notes (Signed)
Patient profile: Terri Miranda is a 74 y.o. female seen for evaluation of constipation. Last seen for colonoscopy 11/2018.   History of Present Illness: Terri Miranda is seen today for follow up. Report some mild constipation recently, havinga bM q2-3 days. Has difficulty starting defecation w/ straining and discomfort. Also has issues w/ bloating and gas when constipation. No blood in stool. She reports miralax caused bloating to be worse. Senna has helped some but not totally relieved symptoms. Also tried stool softener without relief of symptoms. No med changes. Feels she gets adequate fluid.   Patient denies nausea, vomiting, GERD, dysphagia, epigastric pain and weight loss.   Wt Readings from Last 3 Encounters:  11/21/19 210 lb (95.3 kg)  11/14/18 205 lb (93 kg)  09/25/18 200 lb (90.7 kg)     Last Colonoscopy: 11/2018-- Diverticulosis in the sigmoid colon, at the hepatic flexure and in the ascending colon. - One diminutive polyp at the appendiceal orifice. Biopsied. - One 6 mm polyp in the proximal transverse colon, removed with a cold snare. Complete resection. Partial retrieval. - External hemorrhoids.   PATH--Biopsy results reviewed with patient. Patient had 2 small polyps and these are both sessile serrated polyps. One polyp was located at base of appendix/orifice and removed via cold biopsy. The site needs to be reexamined to make sure that there is no residual polyp or recurrence. Therefore recommend next colonoscopy in 1 year.   Last Endoscopy:    Past Medical History:  Past Medical History:  Diagnosis Date  . Essential hypertension   . Mitral valve prolapse   . Mixed hyperlipidemia   . Palpitations   . Pre-diabetes     Problem List: Patient Active Problem List   Diagnosis Date Noted  . Special screening for malignant neoplasms, colon 05/03/2018  . Ankle pain 10/16/2015  . UTI 08/08/2008  . BACK PAIN 05/19/2008  . DIZZINESS 05/19/2008  . DIABETES  MELLITUS, TYPE II 02/19/2008  . HYPERLIPIDEMIA 02/19/2008  . HYPERTENSION 02/19/2008  . ALLERGIC RHINITIS 02/19/2008    Past Surgical History: Past Surgical History:  Procedure Laterality Date  . BREAST EXCISIONAL BIOPSY Right   . COLONOSCOPY N/A 11/14/2018   Procedure: COLONOSCOPY;  Surgeon: Rogene Houston, MD;  [facility];  Service: Endoscopy;  Laterality: N/A;  1030  . POLYPECTOMY  11/14/2018   Procedure: POLYPECTOMY;  Surgeon: Rogene Houston, MD;  [facility]: [facility];  Service: Endoscopy;;  colon    Allergies: Allergies  Allergen Reactions  . Shellfish Allergy Anaphylaxis and Hives  . Lovastatin     myalgias      Home Medications:  Current Outpatient Medications:  .  acetaminophen (TYLENOL) 500 MG tablet, Take 500 mg by mouth every 6 (six) hours as needed for moderate pain or headache., Disp: , Rfl:  .  allopurinol (ZYLOPRIM) 100 MG tablet, Take 100 mg by mouth daily., Disp: , Rfl:  .  aspirin EC 81 MG tablet, Take 1 tablet (81 mg total) by mouth daily., Disp:  , Rfl:  .  cholecalciferol (VITAMIN D3) 25 MCG (1000 UT) tablet, Take 1,000 Units by mouth daily., Disp: , Rfl:  .  diclofenac sodium (VOLTAREN) 1 % GEL, Apply 2 g topically 2 (two) times daily as needed (knee pain). , Disp: , Rfl:  .  enalapril (VASOTEC) 10 MG tablet, Take 10 mg by mouth every evening., Disp: , Rfl:  .  fish oil-omega-3 fatty acids 1000 MG capsule, Take 1 g by mouth daily.,  Disp: , Rfl:  .  hydrochlorothiazide (HYDRODIURIL) 25 MG tablet, Take 25 mg by mouth daily., Disp: , Rfl:  .  loratadine (CLARITIN) 10 MG tablet, Take 10 mg by mouth daily as needed for allergies., Disp: , Rfl:  .  metFORMIN (GLUCOPHAGE-XR) 500 MG 24 hr tablet, Take 500 mg by mouth at bedtime. , Disp: , Rfl:  .  Multiple Vitamin (MULTIVITAMIN WITH MINERALS) TABS, Take 1 tablet by mouth every other day. , Disp: , Rfl:  .  oxymetazoline (NASAL SPRAY 12 HOUR) 0.05 % nasal spray, Place 2 sprays into the nose daily as  needed for congestion. , Disp: , Rfl:  .  pravastatin (PRAVACHOL) 20 MG tablet, Take 20 mg by mouth every evening., Disp: , Rfl:  .  Carboxymethylcellul-Glycerin (LUBRICATING EYE DROPS OP), Place 1 drop into both eyes daily as needed (dry eyes). (Patient not taking: Reported on 11/21/2019), Disp: , Rfl:  .  linaclotide (LINZESS) 72 MCG capsule, Take 1 capsule (72 mcg total) by mouth daily before breakfast. Take 30 min before breakfast., Disp: 30 capsule, Rfl: 3   Family History: family history includes Breast cancer in her maternal aunt; CVA in her mother; Coronary artery disease in her father and sister; Hypertension in her mother and sister; Prostate cancer in her father.    Social History:   reports that she has never smoked. She has never used smokeless tobacco. She reports current alcohol use. She reports previous drug use.   Review of Systems: Constitutional: Denies weight loss/weight gain  Eyes: No changes in vision. ENT: No oral lesions, sore throat.  GI: see HPI.  Heme/Lymph: No easy bruising.  CV: No chest pain.  GU: No hematuria.  Integumentary: No rashes.  Neuro: No headaches.  Psych: No depression/anxiety.  Endocrine: No heat/cold intolerance.  Allergic/Immunologic: No urticaria.  Resp: No cough, SOB.  Musculoskeletal: No joint swelling.    Physical Examination: BP (!) 148/70 (BP Location: Left Arm, Patient Position: Sitting, Cuff Size: Normal)   Pulse 88   Temp 99 F (37.2 C) (Oral)   Ht 5\' 7"  (1.702 m)   Wt 210 lb (95.3 kg)   BMI 32.89 kg/m  Gen: NAD, alert and oriented x 4 HEENT: PEERLA, EOMI, Neck: supple, no JVD Chest: CTA bilaterally, no wheezes, crackles, or other adventitious sounds CV: RRR, no m/g/c/r Abd: soft, NT, ND, +BS in all four quadrants; no HSM, guarding, ridigity, or rebound tenderness Ext: no edema, well perfused with 2+ pulses, Skin: no rash or lesions noted on observed skin Lymph: no noted LAD  Data  Reviewed:     Assessment/Plan: Ms. Siever is a 74 y.o. female seen for hx of colon polyps and constipation.   1. Constipation - mild, bloating w/ miralax and incomplete relief w/ stool softeners. Will try course of low dose linzess. Diet modifications reviewed.   2. Hx colon polyps - due for repeat colonoscopy to reexamine site of prior polypectomy. Did review to contact me prior to colonoscopy if constipation not improved as want her bowels moving prior to prep to ensure prep adequate in right colon.   Patient denies CP, SOB, and use of blood thinners. I discussed the risks and benefits of procedure including bleeding, perforation, infection, missed lesions, medication reactions and possible hospitalization or surgery if complications. All questions answered.    Hermelinda was seen today for constipation.  Diagnoses and all orders for this visit:  History of colonic polyps  Other orders -     linaclotide (LINZESS) 72  MCG capsule; Take 1 capsule (72 mcg total) by mouth daily before breakfast. Take 30 min before breakfast.      I personally performed the service, non-incident to. (WP)  Laurine Blazer, [facility]

## 2019-11-21 NOTE — Telephone Encounter (Signed)
Patient needs Plenvu (copay card) ° °

## 2019-11-21 NOTE — Patient Instructions (Signed)
We are scheduling a colonoscopy for evaluation.  We are going to try Linzess 72 mcg for constipation-take this 30 minutes before breakfast.

## 2019-11-22 ENCOUNTER — Other Ambulatory Visit: Payer: Self-pay | Admitting: Internal Medicine

## 2019-11-22 DIAGNOSIS — Z1231 Encounter for screening mammogram for malignant neoplasm of breast: Secondary | ICD-10-CM

## 2019-11-22 MED ORDER — PLENVU 140 G PO SOLR: 1.0000 | Freq: Once | ORAL | 0 refills | Status: AC

## 2019-11-28 ENCOUNTER — Other Ambulatory Visit: Payer: Self-pay | Admitting: *Deleted

## 2019-11-28 NOTE — Patient Outreach (Signed)
[facility]) Care Management  [place]  11/28/2019   Terri Miranda 1946/01/22 [id number]  Subjective: Successful telephone outreach call to patient. HIPAA identifiers obtained. Patient states she is doing well. She reports that her B/P has been running about the 130's over 80's range and her blood sugar today was 131 which is a little higher than normal. Patient is currently back home after a long stay over the summer at her husband's family farm. Patient continues to follow a low salt and diabetic diet and she keeps physically active by doing house work and running errands; explaining she is up doing something most of the time. She states she has all of the needed provider appointments set up. In October she will see her PCP, eye doctor, and orthopedist. She is scheduled for a colonoscopy on 01/30/20. Nurse reminded the patient not to forget about her annual high dose flu shot and she shared that she will ask for it at her upcoming PCP's visit. Otherwise, patient reports she is doing well and did confirm that she has this nurse's contact number if needed.  Encounter Medications:  Outpatient Encounter Medications as of 11/28/2019  Medication Sig  . acetaminophen (TYLENOL) 500 MG tablet Take 500 mg by mouth every 6 (six) hours as needed for moderate pain or headache.  . allopurinol (ZYLOPRIM) 100 MG tablet Take 100 mg by mouth daily.  Marland Kitchen aspirin EC 81 MG tablet Take 1 tablet (81 mg total) by mouth daily.  . Carboxymethylcellul-Glycerin (LUBRICATING EYE DROPS OP) Place 1 drop into both eyes daily as needed (dry eyes).   . cholecalciferol (VITAMIN D3) 25 MCG (1000 UT) tablet Take 1,000 Units by mouth daily.  . diclofenac sodium (VOLTAREN) 1 % GEL Apply 2 g topically 2 (two) times daily as needed (knee pain).   . enalapril (VASOTEC) 10 MG tablet Take 10 mg by mouth every evening.  . fish oil-omega-3 fatty acids 1000 MG capsule Take 1 g by mouth daily.  .  hydrochlorothiazide (HYDRODIURIL) 25 MG tablet Take 25 mg by mouth daily.  Marland Kitchen loratadine (CLARITIN) 10 MG tablet Take 10 mg by mouth daily as needed for allergies.  . metFORMIN (GLUCOPHAGE-XR) 500 MG 24 hr tablet Take 500 mg by mouth at bedtime.   . Multiple Vitamin (MULTIVITAMIN WITH MINERALS) TABS Take 1 tablet by mouth every other day.   Marland Kitchen oxymetazoline (NASAL SPRAY 12 HOUR) 0.05 % nasal spray Place 2 sprays into the nose daily as needed for congestion.   . pravastatin (PRAVACHOL) 20 MG tablet Take 20 mg by mouth every evening.  . linaclotide (LINZESS) 72 MCG capsule Take 1 capsule (72 mcg total) by mouth daily before breakfast. Take 30 min before breakfast. (Patient not taking: Reported on 11/28/2019)   No facility-administered encounter medications on file as of 11/28/2019.    Functional Status:  In your present state of health, do you have any difficulty performing the following activities: 07/18/2019  Hearing? N  Vision? N  Difficulty concentrating or making decisions? N  Walking or climbing stairs? N  Dressing or bathing? N  Doing errands, shopping? N  Preparing Food and eating ? N  Using the Toilet? N  In the past six months, have you accidently leaked urine? N  Do you have problems with loss of bowel control? N  Managing your Medications? N  Managing your Finances? N  Housekeeping or managing your Housekeeping? N  Some recent data might be hidden    Fall/Depression Screening: Fall Risk  11/28/2019 09/27/2019 07/18/2019  Falls in the past year? _0 Number falls in past yr: 1 1 0  Injury with Fall? 0 0 0  Risk for fall due to : History of fall(s) (No Data) History of fall(s)  Risk for fall due to: Comment - 2 falls per pt. both accidents and she typically feels steady on her feet -  Follow up Falls prevention discussed;Education provided;Falls evaluation completed Falls prevention discussed;Education provided;Falls evaluation completed Falls prevention discussed;Education  provided;Falls evaluation completed   PHQ 2/9 Scores 11/28/2019 07/18/2019  PHQ - 2 Score 0 0   Goals Addressed            This Visit's Progress   . Patient will verbalized B/P  maintained < 140/90 within the next 90 days   On track    [place] (see longtitudinal plan of care for additional care plan information)  Objective:  . Last practice recorded BP readings:  BP Readings from Last 3 Encounters:  11/14/18 (!) 102/48  09/25/18 (!) 142/73  04/10/18 130/89 .   Marland Kitchen Most recent eGFR/CrCl: No results found for: EGFR  No components found for: CRCL  Current Barriers:  Marland Kitchen Knowledge deficit related to self care management of hypertension  Case Manager Clinical Goal(s):  Marland Kitchen Over the next 90 days, patient will verbalize understanding of plan for hypertension management . Over the next 90 days, patient will attend all scheduled medical appointments:   . Over the next 90 days, patient will demonstrate improved adherence to prescribed treatment plan for hypertension as evidenced by taking all medications as prescribed, monitoring and recording blood pressure as directed, adhering to low sodium/DASH diet . Over the next 90 days, patient will verbalize basic understanding of hypertension disease process and self health management plan as evidenced by patient increasing the amount of physical activity she does and by starting to take her B/P daily and recording values.  Interventions:  . Evaluation of current treatment plan related to hypertension self management and patient's adherence to plan as established by provider. . Reviewed medications with patient and discussed importance of compliance . Advised patient, providing education and rationale, to monitor blood pressure daily and record, calling PCP for findings outside established parameters.  . Provided education regarding s/s of DASH diet/Low salt diet . Provided education regarding increasing physical activity  Patient Self Care  Activities:  . Self administers medications as prescribed . Attends all scheduled provider appointments . Adheres to a low sodium diet/DASH diet . Increase physical activity as tolerated . Patient states that she will begin to take her B/P daily and record values.   Please see past updates related to this goal by clicking on the "Past Updates" button in the selected goal          Plan: Terri Miranda will send the PCP today's assessment note, will send the patient Gluc[employer], will call patient within the month of November, and patient agrees to future outreach calls.   [name] RN, BSN [place] [phone] [email or web address]

## 2019-12-06 ENCOUNTER — Telehealth (INDEPENDENT_AMBULATORY_CARE_PROVIDER_SITE_OTHER): Payer: Self-pay | Admitting: *Deleted

## 2019-12-06 NOTE — Telephone Encounter (Signed)
Patient is schedule on November for TCS with Dr Laural Golden - she wants earlier appt so I offered to schedule her with Dr Jenetta Downer and she said she would do that - is this ok to do - if so will you do new order for Dr C and I need to know which room for him.. thanks

## 2019-12-09 ENCOUNTER — Other Ambulatory Visit (INDEPENDENT_AMBULATORY_CARE_PROVIDER_SITE_OTHER): Payer: Self-pay | Admitting: Gastroenterology

## 2019-12-09 NOTE — Telephone Encounter (Signed)
Order set for colonoscopy updated. Room one should be fine. Thanks Ann.

## 2019-12-09 NOTE — Progress Notes (Signed)
Order set updated.

## 2019-12-10 NOTE — Telephone Encounter (Signed)
TCS resch'd to 12/25/19 with Dr Jenetta Downer - patient aware

## 2019-12-17 DIAGNOSIS — R69 Illness, unspecified: Secondary | ICD-10-CM | POA: Diagnosis not present

## 2019-12-18 DIAGNOSIS — E119 Type 2 diabetes mellitus without complications: Secondary | ICD-10-CM | POA: Diagnosis not present

## 2019-12-18 DIAGNOSIS — H5203 Hypermetropia, bilateral: Secondary | ICD-10-CM | POA: Diagnosis not present

## 2019-12-18 DIAGNOSIS — H524 Presbyopia: Secondary | ICD-10-CM | POA: Diagnosis not present

## 2019-12-18 DIAGNOSIS — H04123 Dry eye syndrome of bilateral lacrimal glands: Secondary | ICD-10-CM | POA: Diagnosis not present

## 2019-12-18 DIAGNOSIS — H2513 Age-related nuclear cataract, bilateral: Secondary | ICD-10-CM | POA: Diagnosis not present

## 2019-12-18 DIAGNOSIS — H40023 Open angle with borderline findings, high risk, bilateral: Secondary | ICD-10-CM | POA: Diagnosis not present

## 2019-12-23 ENCOUNTER — Encounter (HOSPITAL_COMMUNITY): Payer: Self-pay | Admitting: Gastroenterology

## 2019-12-23 ENCOUNTER — Other Ambulatory Visit: Payer: Self-pay

## 2019-12-23 ENCOUNTER — Other Ambulatory Visit (HOSPITAL_COMMUNITY)
Admission: RE | Admit: 2019-12-23 | Discharge: 2019-12-23 | Disposition: A | Discharge status: Home / Self Care | Payer: Medicare HMO | Source: Ambulatory Visit | Attending: Gastroenterology | Admitting: Gastroenterology

## 2019-12-23 DIAGNOSIS — E119 Type 2 diabetes mellitus without complications: Secondary | ICD-10-CM | POA: Diagnosis not present

## 2019-12-23 DIAGNOSIS — R002 Palpitations: Secondary | ICD-10-CM | POA: Diagnosis not present

## 2019-12-23 DIAGNOSIS — Z91013 Allergy to seafood: Secondary | ICD-10-CM | POA: Diagnosis not present

## 2019-12-23 DIAGNOSIS — Z1211 Encounter for screening for malignant neoplasm of colon: Secondary | ICD-10-CM | POA: Diagnosis not present

## 2019-12-23 DIAGNOSIS — Z8042 Family history of malignant neoplasm of prostate: Secondary | ICD-10-CM | POA: Diagnosis not present

## 2019-12-23 DIAGNOSIS — E782 Mixed hyperlipidemia: Secondary | ICD-10-CM | POA: Diagnosis not present

## 2019-12-23 DIAGNOSIS — Z8249 Family history of ischemic heart disease and other diseases of the circulatory system: Secondary | ICD-10-CM | POA: Diagnosis not present

## 2019-12-23 DIAGNOSIS — Z791 Long term (current) use of non-steroidal anti-inflammatories (NSAID): Secondary | ICD-10-CM | POA: Diagnosis not present

## 2019-12-23 DIAGNOSIS — Z7984 Long term (current) use of oral hypoglycemic drugs: Secondary | ICD-10-CM | POA: Diagnosis not present

## 2019-12-23 DIAGNOSIS — Z7982 Long term (current) use of aspirin: Secondary | ICD-10-CM | POA: Diagnosis not present

## 2019-12-23 DIAGNOSIS — Z823 Family history of stroke: Secondary | ICD-10-CM | POA: Diagnosis not present

## 2019-12-23 DIAGNOSIS — K648 Other hemorrhoids: Secondary | ICD-10-CM | POA: Diagnosis not present

## 2019-12-23 DIAGNOSIS — Z01818 Encounter for other preprocedural examination: Secondary | ICD-10-CM | POA: Diagnosis not present

## 2019-12-23 DIAGNOSIS — I10 Essential (primary) hypertension: Secondary | ICD-10-CM | POA: Diagnosis not present

## 2019-12-23 DIAGNOSIS — Z79899 Other long term (current) drug therapy: Secondary | ICD-10-CM | POA: Diagnosis not present

## 2019-12-23 DIAGNOSIS — Z86010 Personal history of colonic polyps: Secondary | ICD-10-CM | POA: Diagnosis not present

## 2019-12-23 DIAGNOSIS — Z20822 Contact with and (suspected) exposure to covid-19: Secondary | ICD-10-CM | POA: Diagnosis not present

## 2019-12-23 DIAGNOSIS — D122 Benign neoplasm of ascending colon: Secondary | ICD-10-CM | POA: Diagnosis not present

## 2019-12-23 DIAGNOSIS — Z803 Family history of malignant neoplasm of breast: Secondary | ICD-10-CM | POA: Diagnosis not present

## 2019-12-23 DIAGNOSIS — K5730 Diverticulosis of large intestine without perforation or abscess without bleeding: Secondary | ICD-10-CM | POA: Diagnosis not present

## 2019-12-23 DIAGNOSIS — I341 Nonrheumatic mitral (valve) prolapse: Secondary | ICD-10-CM | POA: Diagnosis not present

## 2019-12-23 DIAGNOSIS — Z888 Allergy status to other drugs, medicaments and biological substances status: Secondary | ICD-10-CM | POA: Diagnosis not present

## 2019-12-23 LAB — BASIC METABOLIC PANEL
Anion gap: 9 (ref 5–15)
BUN: 23 mg/dL (ref 8–23)
CO2: 29 mmol/L (ref 22–32)
Calcium: 9.4 mg/dL (ref 8.9–10.3)
Chloride: 97 mmol/L — ABNORMAL LOW (ref 98–111)
Creatinine, Ser: 1.44 mg/dL — ABNORMAL HIGH (ref 0.44–1.00)
GFR, Estimated: 36 mL/min — ABNORMAL LOW (ref >60)
Glucose, Bld: 94 mg/dL (ref 70–99)
Potassium: 3.8 mmol/L (ref 3.5–5.1)
Sodium: 135 mmol/L (ref 135–145)

## 2019-12-24 LAB — SARS CORONAVIRUS 2 (TAT 6-24 HRS): SARS Coronavirus 2: NEGATIVE

## 2019-12-25 ENCOUNTER — Ambulatory Visit (HOSPITAL_COMMUNITY): Payer: Medicare HMO | Admitting: Anesthesiology

## 2019-12-25 ENCOUNTER — Ambulatory Visit (HOSPITAL_COMMUNITY)
Admission: RE | Admit: 2019-12-25 | Discharge: 2019-12-25 | Disposition: A | Discharge status: Home / Self Care | Payer: Medicare HMO | Attending: Gastroenterology | Admitting: Gastroenterology

## 2019-12-25 ENCOUNTER — Encounter (HOSPITAL_COMMUNITY): Payer: Self-pay | Admitting: Gastroenterology

## 2019-12-25 ENCOUNTER — Other Ambulatory Visit: Payer: Self-pay

## 2019-12-25 ENCOUNTER — Encounter (HOSPITAL_COMMUNITY)
Admission: RE | Disposition: A | Discharge status: Home / Self Care | Payer: Self-pay | Source: Home / Self Care | Attending: Gastroenterology

## 2019-12-25 DIAGNOSIS — E119 Type 2 diabetes mellitus without complications: Secondary | ICD-10-CM | POA: Insufficient documentation

## 2019-12-25 DIAGNOSIS — K5730 Diverticulosis of large intestine without perforation or abscess without bleeding: Secondary | ICD-10-CM | POA: Insufficient documentation

## 2019-12-25 DIAGNOSIS — Z7984 Long term (current) use of oral hypoglycemic drugs: Secondary | ICD-10-CM | POA: Diagnosis not present

## 2019-12-25 DIAGNOSIS — R002 Palpitations: Secondary | ICD-10-CM | POA: Insufficient documentation

## 2019-12-25 DIAGNOSIS — E782 Mixed hyperlipidemia: Secondary | ICD-10-CM | POA: Diagnosis not present

## 2019-12-25 DIAGNOSIS — Z823 Family history of stroke: Secondary | ICD-10-CM | POA: Insufficient documentation

## 2019-12-25 DIAGNOSIS — Z803 Family history of malignant neoplasm of breast: Secondary | ICD-10-CM | POA: Insufficient documentation

## 2019-12-25 DIAGNOSIS — Z79899 Other long term (current) drug therapy: Secondary | ICD-10-CM | POA: Insufficient documentation

## 2019-12-25 DIAGNOSIS — D122 Benign neoplasm of ascending colon: Secondary | ICD-10-CM | POA: Insufficient documentation

## 2019-12-25 DIAGNOSIS — I10 Essential (primary) hypertension: Secondary | ICD-10-CM | POA: Insufficient documentation

## 2019-12-25 DIAGNOSIS — K648 Other hemorrhoids: Secondary | ICD-10-CM | POA: Insufficient documentation

## 2019-12-25 DIAGNOSIS — Z86010 Personal history of colonic polyps: Secondary | ICD-10-CM | POA: Insufficient documentation

## 2019-12-25 DIAGNOSIS — Z8042 Family history of malignant neoplasm of prostate: Secondary | ICD-10-CM | POA: Insufficient documentation

## 2019-12-25 DIAGNOSIS — Z91013 Allergy to seafood: Secondary | ICD-10-CM | POA: Insufficient documentation

## 2019-12-25 DIAGNOSIS — Z8249 Family history of ischemic heart disease and other diseases of the circulatory system: Secondary | ICD-10-CM | POA: Insufficient documentation

## 2019-12-25 DIAGNOSIS — Z791 Long term (current) use of non-steroidal anti-inflammatories (NSAID): Secondary | ICD-10-CM | POA: Insufficient documentation

## 2019-12-25 DIAGNOSIS — Z09 Encounter for follow-up examination after completed treatment for conditions other than malignant neoplasm: Secondary | ICD-10-CM | POA: Diagnosis not present

## 2019-12-25 DIAGNOSIS — I341 Nonrheumatic mitral (valve) prolapse: Secondary | ICD-10-CM | POA: Insufficient documentation

## 2019-12-25 DIAGNOSIS — K635 Polyp of colon: Secondary | ICD-10-CM | POA: Diagnosis not present

## 2019-12-25 DIAGNOSIS — Z888 Allergy status to other drugs, medicaments and biological substances status: Secondary | ICD-10-CM | POA: Insufficient documentation

## 2019-12-25 DIAGNOSIS — K649 Unspecified hemorrhoids: Secondary | ICD-10-CM | POA: Diagnosis not present

## 2019-12-25 DIAGNOSIS — D123 Benign neoplasm of transverse colon: Secondary | ICD-10-CM

## 2019-12-25 DIAGNOSIS — Z1211 Encounter for screening for malignant neoplasm of colon: Secondary | ICD-10-CM | POA: Insufficient documentation

## 2019-12-25 DIAGNOSIS — Z7982 Long term (current) use of aspirin: Secondary | ICD-10-CM | POA: Insufficient documentation

## 2019-12-25 HISTORY — PX: COLONOSCOPY WITH PROPOFOL: SHX5780

## 2019-12-25 HISTORY — PX: POLYPECTOMY: SHX5525

## 2019-12-25 LAB — GLUCOSE, CAPILLARY: Glucose-Capillary: 94 mg/dL (ref 70–99)

## 2019-12-25 LAB — HM COLONOSCOPY

## 2019-12-25 SURGERY — COLONOSCOPY WITH PROPOFOL
Anesthesia: General

## 2019-12-25 MED ORDER — STERILE WATER FOR IRRIGATION IR SOLN
Status: DC | PRN
  Administered 2019-12-25: 100 mL

## 2019-12-25 MED ORDER — EPHEDRINE SULFATE 50 MG/ML IJ SOLN
INTRAMUSCULAR | Status: DC | PRN
  Administered 2019-12-25 (×3): 10 mg via INTRAVENOUS

## 2019-12-25 MED ORDER — PROPOFOL 10 MG/ML IV BOLUS
INTRAVENOUS | Status: DC | PRN
  Administered 2019-12-25 (×2): 50 mg via INTRAVENOUS

## 2019-12-25 MED ORDER — LACTATED RINGERS IV SOLN: INTRAVENOUS | Status: DC | PRN

## 2019-12-25 MED ORDER — SODIUM CHLORIDE 0.9 % IV SOLN: INTRAVENOUS | Status: DC

## 2019-12-25 MED ORDER — PROPOFOL 500 MG/50ML IV EMUL
INTRAVENOUS | Status: DC | PRN
  Administered 2019-12-25: 150 ug/kg/min via INTRAVENOUS

## 2019-12-25 MED ORDER — AMOXICILLIN-POT CLAVULANATE 875-125 MG PO TABS: 1.0000 | ORAL_TABLET | Freq: Two times a day (BID) | ORAL | 0 refills | Status: AC

## 2019-12-25 MED ORDER — LACTATED RINGERS IV SOLN: Freq: Once | INTRAVENOUS | Status: AC

## 2019-12-25 MED ORDER — PHENYLEPHRINE HCL (PRESSORS) 10 MG/ML IV SOLN
INTRAVENOUS | Status: DC | PRN
  Administered 2019-12-25: 120 ug via INTRAVENOUS
  Administered 2019-12-25 (×2): 80 ug via INTRAVENOUS

## 2019-12-25 NOTE — H&P (Signed)
Terri Miranda is an 74 y.o. female.   Chief Complaint: History of colonic polyps HPI: 74 year old female with past medical history of constipation, essential hypertension, hyperlipidemia, and history of colonic polyps, who comes to the hospital for follow-up of colonic polyps.  Patient had her most recent colonoscopy on September 2020.  She was found to have a diminutive polyp in the appendiceal orifice which was removed.  Also had a 6 mm polyp in the proximal transverse which was removed completely with partial retrieval.  Both polyps were sessile serrated polyps.  Patient was recommended to have a repeat colonoscopy a year later to make sure there was no residual polyp at the site of polypectomy in the appendix.  Patient denies having any complaints at the moment. The patient denies having any nausea, vomiting, fever, chills, hematochezia, melena, hematemesis, abdominal distention, abdominal pain, diarrhea, jaundice, pruritus or weight loss.  She has no family members with history of colorectal cancer.  Past Medical History:  Diagnosis Date  . Essential hypertension   . Mitral valve prolapse   . Mixed hyperlipidemia   . Palpitations   . Pre-diabetes     Past Surgical History:  Procedure Laterality Date  . BREAST EXCISIONAL BIOPSY Right   . COLONOSCOPY N/A 11/14/2018   Procedure: COLONOSCOPY;  Surgeon: Rogene Houston, MD;  [facility];  Service: Endoscopy;  Laterality: N/A;  1030  . POLYPECTOMY  11/14/2018   Procedure: POLYPECTOMY;  Surgeon: Rogene Houston, MD;  [facility];  Service: Endoscopy;;  colon    Family History  Problem Relation Age of Onset  . Hypertension Mother   . CVA Mother   . Coronary artery disease Father   . Prostate cancer Father   . Hypertension Sister   . Coronary artery disease Sister   . Breast cancer Maternal Aunt    Social History:  reports that she has never smoked. She has never used smokeless tobacco. She reports current  alcohol use. She reports previous drug use.  Allergies:  Allergies  Allergen Reactions  . Shellfish Allergy Anaphylaxis and Hives  . Lovastatin     myalgias    Medications Prior to Admission  Medication Sig Dispense Refill  . acetaminophen (TYLENOL) 500 MG tablet Take 500 mg by mouth every 6 (six) hours as needed for moderate pain or headache.    . allopurinol (ZYLOPRIM) 100 MG tablet Take 100 mg by mouth daily.    Marland Kitchen aspirin EC 81 MG tablet Take 1 tablet (81 mg total) by mouth daily.    . cholecalciferol (VITAMIN D3) 25 MCG (1000 UT) tablet Take 1,000 Units by mouth daily.    . diclofenac sodium (VOLTAREN) 1 % GEL Apply 2 g topically 2 (two) times daily as needed (knee pain).     . enalapril (VASOTEC) 10 MG tablet Take 10 mg by mouth daily.     . fish oil-omega-3 fatty acids 1000 MG capsule Take 1 g by mouth daily.    . hydrochlorothiazide (HYDRODIURIL) 25 MG tablet Take 25 mg by mouth daily.    Marland Kitchen loratadine (CLARITIN) 10 MG tablet Take 10 mg by mouth daily as needed for allergies.    . metFORMIN (GLUCOPHAGE-XR) 500 MG 24 hr tablet Take 500 mg by mouth at bedtime.     . pravastatin (PRAVACHOL) 20 MG tablet Take 20 mg by mouth at bedtime.     . Carboxymethylcellul-Glycerin (LUBRICATING EYE DROPS OP) Place 1 drop into both eyes daily as needed (dry eyes).     Marland Kitchen  linaclotide (LINZESS) 72 MCG capsule Take 1 capsule (72 mcg total) by mouth daily before breakfast. Take 30 min before breakfast. (Patient not taking: Reported on 11/28/2019) 30 capsule 3  . Multiple Vitamin (MULTIVITAMIN WITH MINERALS) TABS Take 1 tablet by mouth every other day.     Marland Kitchen oxymetazoline (NASAL SPRAY 12 HOUR) 0.05 % nasal spray Place 2 sprays into the nose daily as needed for congestion.       Results for orders placed or performed during the hospital encounter of 12/25/19 (from the past 48 hour(s))  Basic metabolic panel     Status: Abnormal   Collection Time: 12/23/19  1:48 PM  Result Value Ref Range   Sodium 135  135 - 145 mmol/L   Potassium 3.8 3.5 - 5.1 mmol/L   Chloride 97 (L) 98 - 111 mmol/L   CO2 29 22 - 32 mmol/L   Glucose, Bld 94 70 - 99 mg/dL    Comment: Glucose reference range applies only to samples taken after fasting for at least 8 hours.   BUN 23 8 - 23 mg/dL   Creatinine, Ser 1.44 (H) 0.44 - 1.00 mg/dL   Calcium 9.4 8.9 - 10.3 mg/dL   GFR, Estimated 36 (L) >60 mL/min   Anion gap 9 5 - 15    Comment: Performed at [facility], [address] [place], [place] [address]   No results found.  Review of Systems  Constitutional: Negative.   HENT: Negative.   Eyes: Negative.   Respiratory: Negative.   Cardiovascular: Negative.   Gastrointestinal: Negative.   Endocrine: Negative.   Genitourinary: Negative.   Musculoskeletal: Negative.   Skin: Negative.   Allergic/Immunologic: Negative.   Neurological: Negative.   Hematological: Negative.   Psychiatric/Behavioral: Negative.     Blood pressure (!) 150/71, temperature 99.3 F (37.4 C), temperature source Oral, resp. rate 12, height 5\' 7"  (1.702 m), weight 93.9 kg, SpO2 100 %. Physical Exam  BP (!) 150/71   Temp 99.3 F (37.4 C) (Oral)   Resp 12   Ht 5\' 7"  (1.702 m)   Wt 93.9 kg   SpO2 100%   BMI 32.42 kg/m  GENERAL: The patient is AO x3, in no acute distress. HEENT: Head is normocephalic and atraumatic. EOMI are intact. Mouth is well hydrated and without lesions. NECK: Supple. No masses LUNGS: Clear to auscultation. No presence of rhonchi/wheezing/rales. Adequate chest expansion HEART: RRR, normal s1 and s2. ABDOMEN: Soft, nontender, no guarding, no peritoneal signs, and nondistended. BS +. No masses. EXTREMITIES: Without any cyanosis, clubbing, rash, lesions or edema. NEUROLOGIC: AOx3, no focal motor deficit. SKIN: no jaundice, no rashes  Assessment/Plan 74 year old female with past medical history of constipation, essential hypertension, hyperlipidemia, and history of colonic polyps, who comes to the hospital for  follow-up of colonic polyps. Will proceed with surveillance colonoscopy.  Harvel Quale, MD 12/25/2019, 10:49 AM

## 2019-12-25 NOTE — Anesthesia Preprocedure Evaluation (Addendum)
Anesthesia Evaluation  Patient identified by MRN, date of birth, ID band Patient awake    Reviewed: Allergy & Precautions, NPO status , Patient's Chart, lab work & pertinent test results  History of Anesthesia Complications Negative for: history of anesthetic complications  Airway Mallampati: II  TM Distance: >3 FB Neck ROM: Full    Dental  (+) Dental Advisory Given, Caps   Pulmonary neg pulmonary ROS,    Pulmonary exam normal breath sounds clear to auscultation       Cardiovascular Exercise Tolerance: Good hypertension, Pt. on medications + dysrhythmias (palpitations ) + Valvular Problems/Murmurs MVP  Rhythm:Regular Rate:Normal + Systolic murmurs [id number] 09:33:25 [facility] ROUTINE RECORD Sinus rhythm Nonspecific intraventricular conduction delay Nonspecific repol abnormality, diffuse leads No STEMI. No old tracing for comparison. Confirmed by Nanda Quinton [phone]) on 09/25/2018 9:43:01 AM 22mm/s 26mm/mV 150Hz  9.0.4 CID: [id number] Confirmed By: Nanda Quinton   Neuro/Psych negative neurological ROS  negative psych ROS   GI/Hepatic negative GI ROS, Neg liver ROS,   Endo/Other  diabetes, Well Controlled, Type 2, Oral Hypoglycemic Agents  Renal/GU Renal InsufficiencyRenal disease  negative genitourinary   Musculoskeletal negative musculoskeletal ROS (+)   Abdominal   Peds negative pediatric ROS (+)  Hematology negative hematology ROS (+)   Anesthesia Other Findings   Reproductive/Obstetrics negative OB ROS                         Anesthesia Physical Anesthesia Plan  ASA: II  Anesthesia Plan: General   Post-op Pain Management:    Induction: Intravenous  PONV Risk Score and Plan: TIVA  Airway Management Planned: Nasal Cannula and Natural Airway  Additional Equipment:   Intra-op Plan:   Post-operative Plan:   Informed Consent: I have reviewed the patients  History and Physical, chart, labs and discussed the procedure including the risks, benefits and alternatives for the proposed anesthesia with the patient or authorized representative who has indicated his/her understanding and acceptance.     Dental advisory given  Plan Discussed with: CRNA and Surgeon  Anesthesia Plan Comments:         Anesthesia Quick Evaluation

## 2019-12-25 NOTE — Op Note (Addendum)
[facility] Patient Name: Terri Miranda Procedure Date: 12/25/2019 10:53 AM MRN: [id number] Date of Birth: [birth year] Attending MD: Maylon Peppers ,  CSN: [id number] Age: 74 Admit Type: Outpatient Procedure:                Colonoscopy Indications:              High risk colon cancer surveillance: Personal                            history of colonic polyps Providers:                Maylon Peppers, Lambert Mody, Lurline Del,                            RN, Raphael Gibney, Technician Referring MD:              Medicines:                Monitored Anesthesia Care Complications:            No immediate complications. Estimated Blood Loss:     Estimated blood loss: none. Procedure:                Pre-Anesthesia Assessment:                           - Prior to the procedure, a History and Physical                            was performed, and patient medications, allergies                            and sensitivities were reviewed. The patient's                            tolerance of previous anesthesia was reviewed.                           - The risks and benefits of the procedure and the                            sedation options and risks were discussed with the                            patient. All questions were answered and informed                            consent was obtained.                           - ASA Grade Assessment: II - A patient with mild                            systemic disease.                           After obtaining informed consent, the colonoscope  was passed under direct vision. Throughout the                            procedure, the patient's blood pressure, pulse, and                            oxygen saturations were monitored continuously. The                            [id number]-HQ190L ([id number]) scope was introduced through                            the anus and advanced to the the cecum, identified                             by appendiceal orifice and ileocecal valve. The                            colonoscopy was performed without difficulty. The                            patient tolerated the procedure well. The quality                            of the bowel preparation was excellent. Scope                            withdrawal time was 20 minutes. Scope In: 11:00:33 AM Scope Out: 11:51:55 AM Scope Withdrawal Time: 0 hours 39 minutes 30 seconds  Total Procedure Duration: 0 hours 51 minutes 22 seconds  Findings:      The perianal and digital rectal examinations were normal.      A 2 mm polyp was found in the ascending colon. The polyp was sessile.       The polyp was removed with a cold biopsy forceps. Resection and       retrieval were complete.      A 8 mm polyp was found in the proximal ascending colon. The polyp was       flat. Area was successfully injected with 4 mL Orise gel for a lift       polypectomy. The polyp was removed with a cold snare. Resection and       retrieval were complete. Upon reinspection the area, there was presence       of scan muscle fibers. To repair the defect, the tissue edges were       approximated and three hemostatic clips were successfully placed.       Closure of the defect was successful. There was no bleeding at the end       of the procedure.      A 5 mm polyp was found in the transverse colon. The polyp was sessile.       The polyp was removed with a cold snare. Resection and retrieval were       complete.      A few small and large-mouthed diverticula were found in the sigmoid       colon.  Non-bleeding hemorrhoids were found during retroflexion. The hemorrhoids       were small. Impression:               - One 2 mm polyp in the ascending colon, removed                            with a cold biopsy forceps. Resected and retrieved.                           - One 8 mm polyp in the proximal ascending colon,                            removed  with a cold snare. Resected and retrieved.                            Injected. Clips were placed.                           - One 5 mm polyp in the transverse colon, removed                            with a cold snare. Resected and retrieved.                           - Diverticulosis in the sigmoid colon.                           - Non-bleeding hemorrhoids. Moderate Sedation:      Per Anesthesia Care Recommendation:           - Discharge patient to home (ambulatory).                           - Resume previous diet.                           - Await pathology results.                           - Repeat colonoscopy for surveillance based on                            pathology results.                           - Start Augmentin prophylaxis for 5 days. Procedure Code(s):        --- Professional ---                           [phone], GC, Colonoscopy, flexible; with removal of                            tumor(s), polyp(s), or other lesion(s) by snare                            technique  07225, 56, Colonoscopy, flexible; with biopsy,                            single or multiple                           45381, Colonoscopy, flexible; with directed                            submucosal injection(s), any substance Diagnosis Code(s):        --- Professional ---                           Z86.010, Personal history of colonic polyps CPT copyright 2019 American Medical Association. All rights reserved. The codes documented in this report are preliminary and upon coder review may  be revised to meet current compliance requirements. Maylon Peppers, MD Maylon Peppers,  12/25/2019 12:05:32 PM This report has been signed electronically. Number of Addenda: 0

## 2019-12-25 NOTE — Transfer of Care (Signed)
Immediate Anesthesia Transfer of Care Note  Patient: Terri Miranda  Procedure(s) Performed: COLONOSCOPY WITH PROPOFOL (N/A ) POLYPECTOMY  Patient Location: PACU  Anesthesia Type:General  Level of Consciousness: awake, alert , oriented and patient cooperative  Airway & Oxygen Therapy: Patient Spontanous Breathing  Post-op Assessment: Report given to RN, Post -op Vital signs reviewed and stable and Patient moving all extremities X 4  Post vital signs: Reviewed and stable  Last Vitals:  Vitals Value Taken Time  BP    Temp 36.5 C 12/25/19 1156  Pulse    Resp    SpO2      Last Pain:  Vitals:   12/25/19 1156  TempSrc: Oral  PainSc:       Patients Stated Pain Goal: 7 (94/80/16 5537)  Complications: No complications documented.

## 2019-12-25 NOTE — Discharge Instructions (Signed)
You are being discharged to home.  Resume your previous diet.  We are waiting for your pathology results.  Your physician has recommended a repeat colonoscopy for surveillance based on pathology results.  Start Augmentin prophylaxis for 5 days.   Colonoscopy, Adult, Care After This sheet gives you information about how to care for yourself after your procedure. Your doctor may also give you more specific instructions. If you have problems or questions, call your doctor. What can I expect after the procedure? After the procedure, it is common to have:  A small amount of blood in your poop (stool) for 24 hours.  Some gas.  Mild cramping or bloating in your belly (abdomen). Follow these instructions at home: Eating and drinking   Drink enough fluid to keep your pee (urine) pale yellow.  Follow instructions from your doctor about what you cannot eat or drink.  Return to your normal diet as told by your doctor. Avoid heavy or fried foods that are hard to digest. Activity  Rest as told by your doctor.  Do not sit for a long time without moving. Get up to take short walks every 1-2 hours. This is important. Ask for help if you feel weak or unsteady.  Return to your normal activities as told by your doctor. Ask your doctor what activities are safe for you. To help cramping and bloating:   Try walking around.  Put heat on your belly as told by your doctor. Use the heat source that your doctor recommends, such as a moist heat pack or a heating pad. ? Put a towel between your skin and the heat source. ? Leave the heat on for 20-30 minutes. ? Remove the heat if your skin turns bright red. This is very important if you are unable to feel pain, heat, or cold. You may have a greater risk of getting burned. General instructions  For the first 24 hours after the procedure: ? Do not drive or use machinery. ? Do not sign important documents. ? Do not drink alcohol. ? Do your daily  activities more slowly than normal. ? Eat foods that are soft and easy to digest.  Take over-the-counter or prescription medicines only as told by your doctor.  Keep all follow-up visits as told by your doctor. This is important. Contact a doctor if:  You have blood in your poop 2-3 days after the procedure. Get help right away if:  You have more than a small amount of blood in your poop.  You see large clumps of tissue (blood clots) in your poop.  Your belly is swollen.  You feel like you may vomit (nauseous).  You vomit.  You have a fever.  You have belly pain that gets worse, and medicine does not help your pain. Summary  After the procedure, it is common to have a small amount of blood in your poop. You may also have mild cramping and bloating in your belly.  For the first 24 hours after the procedure, do not drive or use machinery, do not sign important documents, and do not drink alcohol.  Get help right away if you have a lot of blood in your poop, feel like you may vomit, have a fever, or have more belly pain. This information is not intended to replace advice given to you by your health care provider. Make sure you discuss any questions you have with your health care provider. Document Revised: 09/24/2018 Document Reviewed: 09/24/2018 Elsevier Patient Education  [phone]  [place].  Colon Polyps  Polyps are tissue growths inside the body. Polyps can grow in many places, including the large intestine (colon). A polyp may be a round bump or a mushroom-shaped growth. You could have one polyp or several. Most colon polyps are noncancerous (benign). However, some colon polyps can become cancerous over time. Finding and removing the polyps early can help prevent this. What are the causes? The exact cause of colon polyps is not known. What increases the risk? You are more likely to develop this condition if you:  Have a family history of colon cancer or colon  polyps.  Are older than 24 or older than 45 if you are African American.  Have inflammatory bowel disease, such as ulcerative colitis or Crohn's disease.  Have certain hereditary conditions, such as: ? Familial adenomatous polyposis. ? Lynch syndrome. ? Turcot syndrome. ? Peutz-Jeghers syndrome.  Are overweight.  Smoke cigarettes.  Do not get enough exercise.  Drink too much alcohol.  Eat a diet that is high in fat and red meat and low in fiber.  Had childhood cancer that was treated with abdominal radiation. What are the signs or symptoms? Most polyps do not cause symptoms. If you have symptoms, they may include:  Blood coming from your rectum when having a bowel movement.  Blood in your stool. The stool may look dark red or black.  Abdominal pain.  A change in bowel habits, such as constipation or diarrhea. How is this diagnosed? This condition is diagnosed with a colonoscopy. This is a procedure in which a lighted, flexible scope is inserted into the anus and then passed into the colon to examine the area. Polyps are sometimes found when a colonoscopy is done as part of routine cancer screening tests. How is this treated? Treatment for this condition involves removing any polyps that are found. Most polyps can be removed during a colonoscopy. Those polyps will then be tested for cancer. Additional treatment may be needed depending on the results of testing. Follow these instructions at home: Lifestyle  Maintain a healthy weight, or lose weight if recommended by your health care provider.  Exercise every day or as told by your health care provider.  Do not use any products that contain nicotine or tobacco, such as cigarettes and e-cigarettes. If you need help quitting, ask your health care provider.  If you drink alcohol, limit how much you have: ? 0-1 drink a day for women. ? 0-2 drinks a day for men.  Be aware of how much alcohol is in your drink. In the [place]  one drink equals one 12 oz bottle of beer (355 mL), one 5 oz glass of wine (148 mL), or one 1 oz shot of hard liquor (44 mL). Eating and drinking   Eat foods that are high in fiber, such as fruits, vegetables, and whole grains.  Eat foods that are high in calcium and vitamin D, such as milk, cheese, yogurt, eggs, liver, fish, and broccoli.  Limit foods that are high in fat, such as fried foods and desserts.  Limit the amount of red meat and processed meat you eat, such as hot dogs, sausage, bacon, and lunch meats. General instructions  Keep all follow-up visits as told by your health care provider. This is important. ? This includes having regularly scheduled colonoscopies. ? Talk to your health care provider about when you need a colonoscopy. Contact a health care provider if:  You have new or worsening bleeding during a  bowel movement.  You have new or increased blood in your stool.  You have a change in bowel habits.  You lose weight for no known reason. Summary  Polyps are tissue growths inside the body. Polyps can grow in many places, including the colon.  Most colon polyps are noncancerous (benign), but some can become cancerous over time.  This condition is diagnosed with a colonoscopy.  Treatment for this condition involves removing any polyps that are found. Most polyps can be removed during a colonoscopy. This information is not intended to replace advice given to you by your health care provider. Make sure you discuss any questions you have with your health care provider. Document Revised: 06/15/2017 Document Reviewed: 06/15/2017 Elsevier Patient Education  [place].  Diverticulosis  Diverticulosis is a condition that develops when small pouches (diverticula) form in the wall of the large intestine (colon). The colon is where water is absorbed and stool (feces) is formed. The pouches form when the inside layer of the colon pushes through weak spots in the  outer layers of the colon. You may have a few pouches or many of them. The pouches usually do not cause problems unless they become inflamed or infected. When this happens, the condition is called diverticulitis. What are the causes? The cause of this condition is not known. What increases the risk? The following factors may make you more likely to develop this condition:  Being older than age 86. Your risk for this condition increases with age. Diverticulosis is rare among people younger than age 74. By age 73, many people have it.  Eating a low-fiber diet.  Having frequent constipation.  Being overweight.  Not getting enough exercise.  Smoking.  Taking over-the-counter pain medicines, like aspirin and ibuprofen.  Having a family history of diverticulosis. What are the signs or symptoms? In most people, there are no symptoms of this condition. If you do have symptoms, they may include:  Bloating.  Cramps in the abdomen.  Constipation or diarrhea.  Pain in the lower left side of the abdomen. How is this diagnosed? Because diverticulosis usually has no symptoms, it is most often diagnosed during an exam for other colon problems. The condition may be diagnosed by:  Using a flexible scope to examine the colon (colonoscopy).  Taking an X-ray of the colon after dye has been put into the colon (barium enema).  Having a CT scan. How is this treated? You may not need treatment for this condition. Your health care provider may recommend treatment to prevent problems. You may need treatment if you have symptoms or if you previously had diverticulitis. Treatment may include:  Eating a high-fiber diet.  Taking a fiber supplement.  Taking a live bacteria supplement (probiotic).  Taking medicine to relax your colon. Follow these instructions at home: Medicines  Take over-the-counter and prescription medicines only as told by your health care provider.  If told by your health  care provider, take a fiber supplement or probiotic. Constipation prevention Your condition may cause constipation. To prevent or treat constipation, you may need to:  Drink enough fluid to keep your urine pale yellow.  Take over-the-counter or prescription medicines.  Eat foods that are high in fiber, such as beans, whole grains, and fresh fruits and vegetables.  Limit foods that are high in fat and processed sugars, such as fried or sweet foods.  General instructions  Try not to strain when you have a bowel movement.  Keep all follow-up visits as  told by your health care provider. This is important. Contact a health care provider if you:  Have pain in your abdomen.  Have bloating.  Have cramps.  Have not had a bowel movement in 3 days. Get help right away if:  Your pain gets worse.  Your bloating becomes very bad.  You have a fever or chills, and your symptoms suddenly get worse.  You vomit.  You have bowel movements that are bloody or black.  You have bleeding from your rectum. Summary  Diverticulosis is a condition that develops when small pouches (diverticula) form in the wall of the large intestine (colon).  You may have a few pouches or many of them.  This condition is most often diagnosed during an exam for other colon problems.  Treatment may include increasing the fiber in your diet, taking supplements, or taking medicines. This information is not intended to replace advice given to you by your health care provider. Make sure you discuss any questions you have with your health care provider. Document Revised: 09/27/2018 Document Reviewed: 09/27/2018 Elsevier Patient Education  [place].

## 2019-12-25 NOTE — Anesthesia Postprocedure Evaluation (Signed)
Anesthesia Post Note  Patient: Terri Miranda  Procedure(s) Performed: COLONOSCOPY WITH PROPOFOL (N/A ) POLYPECTOMY  Patient location during evaluation: Phase II Anesthesia Type: General Level of consciousness: awake, oriented, awake and alert and patient cooperative Pain management: satisfactory to patient Vital Signs Assessment: post-procedure vital signs reviewed and stable Respiratory status: spontaneous breathing, respiratory function stable and nonlabored ventilation Cardiovascular status: stable Postop Assessment: no apparent nausea or vomiting Anesthetic complications: no   No complications documented.   Last Vitals:  Vitals:   12/25/19 0923 12/25/19 1156  BP: (!) 150/71 128/61  Pulse:  86  Resp: 12 17  Temp: 37.4 C 36.5 C  SpO2: 100% 100%    Last Pain:  Vitals:   12/25/19 1156  TempSrc: Oral  PainSc: 0-No pain                 Kamia Insalaco

## 2019-12-26 ENCOUNTER — Other Ambulatory Visit: Payer: Self-pay | Admitting: Internal Medicine

## 2019-12-26 DIAGNOSIS — Z7984 Long term (current) use of oral hypoglycemic drugs: Secondary | ICD-10-CM | POA: Diagnosis not present

## 2019-12-26 DIAGNOSIS — I10 Essential (primary) hypertension: Secondary | ICD-10-CM | POA: Diagnosis not present

## 2019-12-26 DIAGNOSIS — E1169 Type 2 diabetes mellitus with other specified complication: Secondary | ICD-10-CM | POA: Diagnosis not present

## 2019-12-26 DIAGNOSIS — E2839 Other primary ovarian failure: Secondary | ICD-10-CM

## 2019-12-26 DIAGNOSIS — M109 Gout, unspecified: Secondary | ICD-10-CM | POA: Diagnosis not present

## 2019-12-26 DIAGNOSIS — N183 Chronic kidney disease, stage 3 unspecified: Secondary | ICD-10-CM | POA: Diagnosis not present

## 2019-12-26 DIAGNOSIS — E559 Vitamin D deficiency, unspecified: Secondary | ICD-10-CM | POA: Diagnosis not present

## 2019-12-26 DIAGNOSIS — G4719 Other hypersomnia: Secondary | ICD-10-CM | POA: Diagnosis not present

## 2019-12-26 DIAGNOSIS — Z0000 Encounter for general adult medical examination without abnormal findings: Secondary | ICD-10-CM | POA: Diagnosis not present

## 2019-12-26 DIAGNOSIS — Z1389 Encounter for screening for other disorder: Secondary | ICD-10-CM | POA: Diagnosis not present

## 2019-12-26 DIAGNOSIS — Z23 Encounter for immunization: Secondary | ICD-10-CM | POA: Diagnosis not present

## 2019-12-26 DIAGNOSIS — E669 Obesity, unspecified: Secondary | ICD-10-CM | POA: Diagnosis not present

## 2019-12-26 LAB — SURGICAL PATHOLOGY

## 2019-12-30 ENCOUNTER — Encounter (HOSPITAL_COMMUNITY): Payer: Self-pay | Admitting: Gastroenterology

## 2019-12-31 ENCOUNTER — Telehealth (INDEPENDENT_AMBULATORY_CARE_PROVIDER_SITE_OTHER): Payer: Self-pay

## 2019-12-31 NOTE — Telephone Encounter (Signed)
I called and left a detailed message that per Dr. Jenetta Downer it is fine to have a mammogram she may proceed with it. I asked that she please call me back to confirm she did receive the message. CLS 12/31/2019

## 2019-12-31 NOTE — Telephone Encounter (Signed)
Patient called today states she is scheduled for Mammogram tomorrow and she wanted to make sure that it was ok to do so, as she recently had a colonoscopy with polyp removal and was told that she could not have an MRI as it was sutured up with metal. She just wanted to make sure the mammogram would be ok to do.I advised that I think they told her to stay away from MRI as it is a magnet, but the mammogram should be fine. I have texted Dr. Jenetta Downer to confirm this and will contact the patient to advise.

## 2019-12-31 NOTE — Telephone Encounter (Signed)
[phone] 

## 2020-01-01 ENCOUNTER — Other Ambulatory Visit: Payer: Self-pay

## 2020-01-01 ENCOUNTER — Ambulatory Visit
Admission: RE | Admit: 2020-01-01 | Discharge: 2020-01-01 | Disposition: A | Discharge status: Home / Self Care | Payer: Medicare HMO | Source: Ambulatory Visit | Attending: Internal Medicine | Admitting: Internal Medicine

## 2020-01-01 DIAGNOSIS — Z1231 Encounter for screening mammogram for malignant neoplasm of breast: Secondary | ICD-10-CM

## 2020-01-01 DIAGNOSIS — Z0100 Encounter for examination of eyes and vision without abnormal findings: Secondary | ICD-10-CM | POA: Diagnosis not present

## 2020-01-01 NOTE — Telephone Encounter (Signed)
Spoke with the patient today regarding her metallic clip.  I asked planed to her that most times this clip fall on its own after a few weeks but most of these clips are MRI compatible.  Patient felt reassured.  She is not planning to have any MRIs in anytime soon but she only had that question.  Maylon Peppers, MD Gastroenterology and Hepatology [facility]

## 2020-01-01 NOTE — Telephone Encounter (Signed)
Patient is aware of all,but wants to know for how long should she avoid an MRI as she says someone told her the metal would eventually "drop out". Please advise.

## 2020-01-12 DIAGNOSIS — R69 Illness, unspecified: Secondary | ICD-10-CM | POA: Diagnosis not present

## 2020-01-28 ENCOUNTER — Other Ambulatory Visit (HOSPITAL_COMMUNITY): Payer: Medicare HMO

## 2020-01-30 ENCOUNTER — Ambulatory Visit (HOSPITAL_COMMUNITY): Admit: 2020-01-30 | Payer: Medicare HMO | Admitting: Internal Medicine

## 2020-01-30 ENCOUNTER — Encounter (HOSPITAL_COMMUNITY): Payer: Self-pay

## 2020-01-30 SURGERY — COLONOSCOPY
Anesthesia: Moderate Sedation

## 2020-01-31 ENCOUNTER — Other Ambulatory Visit: Payer: Self-pay | Admitting: *Deleted

## 2020-01-31 NOTE — Patient Outreach (Signed)
[place] [facility]) Care Management  01/31/2020  Terri Miranda 02/24/1946 [id number]  Outreach attempt to patient. No answer and unable to leave voicemail message due to phone picked-up and stated that the memory if full and ask you to enter the access code.   Plan: RN Health Coach will call patient within the month of December and patient agrees to future outreach calls.   [name] RN, BSN [place] [phone] [email or web address]

## 2020-02-18 ENCOUNTER — Encounter (INDEPENDENT_AMBULATORY_CARE_PROVIDER_SITE_OTHER): Payer: Self-pay | Admitting: *Deleted

## 2020-03-04 ENCOUNTER — Other Ambulatory Visit: Payer: Self-pay | Admitting: *Deleted

## 2020-03-04 NOTE — Patient Outreach (Signed)
[facility]) Care Management  [place]  03/04/2020   Terri Miranda [birth year] [id number]  Subjective: Successful telephone outreach call to patient. HIPAA identifiers obtained. Patient states she is doing well. She reports her B/P ranges are systolic 518-841'Y and diastolic 60-63'K. Patient explains that she has been taking her B/P 2-3 times weekly and has not been recording the values due to being away from home currently. Patient explained she will be back home the first of the year, plans to start to take her B/P daily, and she will record her values. Nurse will send patient a calendar booklet for her to record her B/P values; will send the first week of January as requested by the patient. Patient explained that she will see her orthopedic provider in early January and is anticipating that she will have knee surgery. She reports that she has not had any recent falls and continues to be well supported by her husband and family. Patient did not have any further questions or concerns today and did confirm that she has this nurse's contact number to call her if needed.   Encounter Medications:  Outpatient Encounter Medications as of 03/04/2020  Medication Sig  . acetaminophen (TYLENOL) 500 MG tablet Take 500 mg by mouth every 6 (six) hours as needed for moderate pain or headache.  . allopurinol (ZYLOPRIM) 100 MG tablet Take 100 mg by mouth daily.  Marland Kitchen aspirin EC 81 MG tablet Take 1 tablet (81 mg total) by mouth daily.  . Carboxymethylcellul-Glycerin (LUBRICATING EYE DROPS OP) Place 1 drop into both eyes daily as needed (dry eyes).   . cholecalciferol (VITAMIN D3) 25 MCG (1000 UT) tablet Take 1,000 Units by mouth daily.  . diclofenac sodium (VOLTAREN) 1 % GEL Apply 2 g topically 2 (two) times daily as needed (knee pain).   . enalapril (VASOTEC) 10 MG tablet Take 10 mg by mouth daily.   . fish oil-omega-3 fatty acids 1000 MG capsule Take 1 g by mouth daily.  .  hydrochlorothiazide (HYDRODIURIL) 25 MG tablet Take 25 mg by mouth daily.  Marland Kitchen linaclotide (LINZESS) 72 MCG capsule Take 1 capsule (72 mcg total) by mouth daily before breakfast. Take 30 min before breakfast. (Patient not taking: Reported on 11/28/2019)  . loratadine (CLARITIN) 10 MG tablet Take 10 mg by mouth daily as needed for allergies.  . metFORMIN (GLUCOPHAGE-XR) 500 MG 24 hr tablet Take 500 mg by mouth at bedtime.   . Multiple Vitamin (MULTIVITAMIN WITH MINERALS) TABS Take 1 tablet by mouth every other day.   Marland Kitchen oxymetazoline (NASAL SPRAY 12 HOUR) 0.05 % nasal spray Place 2 sprays into the nose daily as needed for congestion.   . pravastatin (PRAVACHOL) 20 MG tablet Take 20 mg by mouth at bedtime.    No facility-administered encounter medications on file as of 03/04/2020.    Functional Status:  In your present state of health, do you have any difficulty performing the following activities: 07/18/2019  Hearing? N  Vision? N  Difficulty concentrating or making decisions? N  Walking or climbing stairs? N  Dressing or bathing? N  Doing errands, shopping? N  Preparing Food and eating ? N  Using the Toilet? N  In the past six months, have you accidently leaked urine? N  Do you have problems with loss of bowel control? N  Managing your Medications? N  Managing your Finances? N  Housekeeping or managing your Housekeeping? N  Some recent data might be hidden    Fall/Depression  Screening: Fall Risk  11/28/2019 09/27/2019 07/18/2019  Falls in the past year? 1 1 1   Number falls in past yr: 1 1 0  Injury with Fall? 0 0 0  Risk for fall due to : History of fall(s) (No Data) History of fall(s)  Risk for fall due to: Comment - 2 falls per pt. both accidents and she typically feels steady on her feet -  Follow up Falls prevention discussed;Education provided;Falls evaluation completed Falls prevention discussed;Education provided;Falls evaluation completed Falls prevention discussed;Education  provided;Falls evaluation completed   PHQ 2/9 Scores 11/28/2019 07/18/2019  PHQ - 2 Score 0 0    Assessment:  Goals Addressed            This Visit's Progress   . Track and Manage My Blood Pressure-Hypertension         Plan: RN Health Coach will call patient within the month of March, will send patient a calendar booklet the first week of January, and will send PCP today's assessment note.  Follow-up:  Patient agrees to Care Plan and Follow-up.   [name] RN, BSN [place] [phone] [email or web address]

## 2020-03-05 ENCOUNTER — Ambulatory Visit: Payer: Self-pay | Admitting: *Deleted

## 2020-03-05 NOTE — Patient Instructions (Signed)
Goals Addressed            This Visit's Progress   . Patient will verbalized B/P  maintained < 140/90 within the next 90 days   On track    [place] (see longtitudinal plan of care for additional care plan information)  Objective:  . Last practice recorded BP readings:  BP Readings from Last 3 Encounters:  11/14/18 (!) 102/48  09/25/18 (!) 142/73  04/10/18 130/89 .   Marland Kitchen Most recent eGFR/CrCl: No results found for: EGFR  No components found for: CRCL  Current Barriers:  Marland Kitchen Knowledge deficit related to self care management of hypertension  Case Manager Clinical Goal(s):  Marland Kitchen Over the next 90 days, patient will verbalize understanding of plan for hypertension management . Over the next 90 days, patient will attend all scheduled medical appointments:   . Over the next 90 days, patient will demonstrate improved adherence to prescribed treatment plan for hypertension as evidenced by taking all medications as prescribed, monitoring and recording blood pressure as directed, adhering to low sodium/DASH diet . Over the next 90 days, patient will verbalize basic understanding of hypertension disease process and self health management plan as evidenced by patient starting to take her B/P daily and recording values.  Interventions:  . Evaluation of current treatment plan related to hypertension self management and patient's adherence to plan as established by provider. . Reviewed medications with patient and discussed importance of compliance . Advised patient, providing education and rationale, to monitor blood pressure daily and record, calling PCP for findings outside established parameters.  . Provided education regarding s/s of DASH diet/Low salt diet . Provided education regarding increasing physical activity  Patient Self Care Activities:  . Self administers medications as prescribed . Attends all scheduled provider appointments . Adheres to a low sodium diet/DASH diet . Increase  physical activity as tolerated . Patient states that she will begin to take her B/P daily and record values.   Please see past updates related to this goal by clicking on the "Past Updates" button in the selected goal    Updated: 03/04/20     . Track and Manage My Blood Pressure-Hypertension       Timeframe:  Long-Range Goal Priority:  High Start Date:                             Expected End Date:                       Follow Up Date 06/10/20    - check blood pressure daily - choose a place to take my blood pressure (home, clinic or office, retail store) - write blood pressure results in a log or diary    Why is this important?    You won't feel high blood pressure, but it can still hurt your blood vessels.   High blood pressure can cause heart or kidney problems. It can also cause a stroke.   Making lifestyle changes like losing a little weight or eating less salt will help.   Checking your blood pressure at home and at different times of the day can help to control blood pressure.   If the doctor prescribes medicine remember to take it the way the doctor ordered.   Call the office if you cannot afford the medicine or if there are questions about it.     Notes: Patient states she is  taking her B/P 2-3 times weekly and has not been recording the values due to being away from home currently. Patient explained she will be back home the first of the year, plans to start to take her B/P daily, and she will record her values. Nurse will send patient a calendar booklet for her to record her B/P values.

## 2020-03-23 DIAGNOSIS — M1990 Unspecified osteoarthritis, unspecified site: Secondary | ICD-10-CM | POA: Diagnosis not present

## 2020-03-23 DIAGNOSIS — G8929 Other chronic pain: Secondary | ICD-10-CM | POA: Diagnosis not present

## 2020-03-23 DIAGNOSIS — E785 Hyperlipidemia, unspecified: Secondary | ICD-10-CM | POA: Diagnosis not present

## 2020-03-23 DIAGNOSIS — I10 Essential (primary) hypertension: Secondary | ICD-10-CM | POA: Diagnosis not present

## 2020-03-23 DIAGNOSIS — N183 Chronic kidney disease, stage 3 unspecified: Secondary | ICD-10-CM | POA: Diagnosis not present

## 2020-03-23 DIAGNOSIS — E1169 Type 2 diabetes mellitus with other specified complication: Secondary | ICD-10-CM | POA: Diagnosis not present

## 2020-03-24 DIAGNOSIS — M170 Bilateral primary osteoarthritis of knee: Secondary | ICD-10-CM | POA: Diagnosis not present

## 2020-04-01 ENCOUNTER — Telehealth: Payer: Self-pay | Admitting: Cardiology

## 2020-04-01 NOTE — Telephone Encounter (Signed)
   [facility] Pre-operative Risk Assessment    HEARTCARE STAFF: - Please ensure there is not already an duplicate clearance open for this procedure. - Under Visit Info/Reason for Call, type in Other and utilize the format Clearance MM/DD/YY or Clearance TBD. Do not use dashes or single digits. - If request is for dental extraction, please clarify the # of teeth to be extracted.  Request for surgical clearance:  1. What type of surgery is being performed? Right total knee replacement  2. When is this surgery scheduled? tbd  3. What type of clearance is required (medical clearance vs. Pharmacy clearance to hold med vs. Both)?  medical  4. Are there any medications that need to be held prior to surgery and how long? Not specified  5. Practice name and name of physician performing surgery? [facility]  6. What is the office phone number? [phone]   7.   What is the office fax number? [phone]  8.   Anesthesia type (None, local, MAC, general) ? Not specified    Terri Miranda 04/01/2020, 11:30 AM  _________________________________________________________________   (provider comments below)

## 2020-04-02 NOTE — Telephone Encounter (Signed)
   Primary Cardiologist: Carlyle Dolly, MD  Chart reviewed as part of pre-operative protocol coverage. Because of Terri Miranda's past medical history and time since last visit, she will require a follow-up visit in order to better assess preoperative cardiovascular risk. She was last seen by our office in 2016.   This has been scheduled for 05/08/20 with Dr. Harl Bowie at [facility]. Appt notes include preop clearance and clearance will be addressed at that visit.   Will route to requesting party so they are aware.  Will remove from preop pool.   Loel Dubonnet, NP  04/02/2020, 10:39 AM

## 2020-04-09 DIAGNOSIS — E790 Hyperuricemia without signs of inflammatory arthritis and tophaceous disease: Secondary | ICD-10-CM | POA: Diagnosis not present

## 2020-04-09 DIAGNOSIS — I10 Essential (primary) hypertension: Secondary | ICD-10-CM | POA: Diagnosis not present

## 2020-04-09 DIAGNOSIS — Z7984 Long term (current) use of oral hypoglycemic drugs: Secondary | ICD-10-CM | POA: Diagnosis not present

## 2020-04-09 DIAGNOSIS — E1169 Type 2 diabetes mellitus with other specified complication: Secondary | ICD-10-CM | POA: Diagnosis not present

## 2020-04-09 DIAGNOSIS — N183 Chronic kidney disease, stage 3 unspecified: Secondary | ICD-10-CM | POA: Diagnosis not present

## 2020-04-15 ENCOUNTER — Ambulatory Visit
Admission: RE | Admit: 2020-04-15 | Discharge: 2020-04-15 | Disposition: A | Discharge status: Home / Self Care | Payer: Medicare HMO | Source: Ambulatory Visit | Attending: Internal Medicine | Admitting: Internal Medicine

## 2020-04-15 ENCOUNTER — Other Ambulatory Visit: Payer: Self-pay

## 2020-04-15 DIAGNOSIS — Z780 Asymptomatic menopausal state: Secondary | ICD-10-CM | POA: Diagnosis not present

## 2020-04-15 DIAGNOSIS — E2839 Other primary ovarian failure: Secondary | ICD-10-CM

## 2020-04-21 DIAGNOSIS — E1169 Type 2 diabetes mellitus with other specified complication: Secondary | ICD-10-CM | POA: Diagnosis not present

## 2020-04-21 DIAGNOSIS — I10 Essential (primary) hypertension: Secondary | ICD-10-CM | POA: Diagnosis not present

## 2020-04-21 DIAGNOSIS — M1990 Unspecified osteoarthritis, unspecified site: Secondary | ICD-10-CM | POA: Diagnosis not present

## 2020-04-21 DIAGNOSIS — E785 Hyperlipidemia, unspecified: Secondary | ICD-10-CM | POA: Diagnosis not present

## 2020-04-21 DIAGNOSIS — G8929 Other chronic pain: Secondary | ICD-10-CM | POA: Diagnosis not present

## 2020-04-21 DIAGNOSIS — N183 Chronic kidney disease, stage 3 unspecified: Secondary | ICD-10-CM | POA: Diagnosis not present

## 2020-04-21 DIAGNOSIS — M1710 Unilateral primary osteoarthritis, unspecified knee: Secondary | ICD-10-CM | POA: Diagnosis not present

## 2020-05-05 DIAGNOSIS — E1169 Type 2 diabetes mellitus with other specified complication: Secondary | ICD-10-CM | POA: Diagnosis not present

## 2020-05-05 DIAGNOSIS — Z01818 Encounter for other preprocedural examination: Secondary | ICD-10-CM | POA: Diagnosis not present

## 2020-05-05 DIAGNOSIS — I10 Essential (primary) hypertension: Secondary | ICD-10-CM | POA: Diagnosis not present

## 2020-05-05 DIAGNOSIS — M1710 Unilateral primary osteoarthritis, unspecified knee: Secondary | ICD-10-CM | POA: Diagnosis not present

## 2020-05-05 DIAGNOSIS — Z7984 Long term (current) use of oral hypoglycemic drugs: Secondary | ICD-10-CM | POA: Diagnosis not present

## 2020-05-08 ENCOUNTER — Telehealth: Payer: Self-pay | Admitting: Cardiology

## 2020-05-08 ENCOUNTER — Encounter: Payer: Self-pay | Admitting: Cardiology

## 2020-05-08 ENCOUNTER — Other Ambulatory Visit: Payer: Self-pay

## 2020-05-08 ENCOUNTER — Encounter: Payer: Self-pay | Admitting: *Deleted

## 2020-05-08 ENCOUNTER — Ambulatory Visit (INDEPENDENT_AMBULATORY_CARE_PROVIDER_SITE_OTHER): Payer: Medicare HMO | Admitting: Cardiology

## 2020-05-08 VITALS — BP 136/74 | HR 77 | Ht 67.5 in | Wt 213.0 lb

## 2020-05-08 DIAGNOSIS — Z01810 Encounter for preprocedural cardiovascular examination: Secondary | ICD-10-CM | POA: Diagnosis not present

## 2020-05-08 DIAGNOSIS — R0602 Shortness of breath: Secondary | ICD-10-CM | POA: Diagnosis not present

## 2020-05-08 DIAGNOSIS — R600 Localized edema: Secondary | ICD-10-CM | POA: Diagnosis not present

## 2020-05-08 NOTE — Patient Instructions (Signed)
Your physician recommends that you schedule a follow-up appointment in: PENDING WITH DR [facility]  Your physician recommends that you continue on your current medications as directed. Please refer to the Current Medication list given to you today.  Your physician has requested that you have an echocardiogram. Echocardiography is a painless test that uses sound waves to create images of your heart. It provides your doctor with information about the size and shape of your heart and how well your heart's chambers and valves are working. This procedure takes approximately one hour. There are no restrictions for this procedure.  Your physician has requested that you have a lexiscan myoview. For further information please visit [email or web address]. Please follow instruction sheet, as given.  Thank you for choosing [facility]!!

## 2020-05-08 NOTE — Telephone Encounter (Signed)
Pre-cert Verification for the following procedure    LEXISCAN  ECHO   DATE:   05/28/2020  LOCATION:  [facility]

## 2020-05-08 NOTE — Progress Notes (Addendum)
Clinical Summary Terri Miranda is a 75 y.o.female last seen by Dr Domenic Polite in 2016. Seen today as a new patient for the following medical problems.  1. Preoperative evaluation -considering knee replacement   - phyiscally limited by knee pain.  - difficultly walking up a flight of stairs due to knee pain.  - limited on flat ground  - can have some SOB mopping, sweeping. - occasional chest tightness, 1-2 times per week. Lasts a few minutes. No other associated symptoms    2. LE edema  increasing swelling she reports over the last few months  Past Medical History:  Diagnosis Date  . Essential hypertension   . Mitral valve prolapse   . Mixed hyperlipidemia   . Palpitations   . Pre-diabetes      Allergies  Allergen Reactions  . Shellfish Allergy Anaphylaxis and Hives  . Lovastatin     myalgias     Current Outpatient Medications  Medication Sig Dispense Refill  . acetaminophen (TYLENOL) 500 MG tablet Take 500 mg by mouth every 6 (six) hours as needed for moderate pain or headache.    . allopurinol (ZYLOPRIM) 100 MG tablet Take 100 mg by mouth daily.    Marland Kitchen aspirin EC 81 MG tablet Take 1 tablet (81 mg total) by mouth daily.    . Carboxymethylcellul-Glycerin (LUBRICATING EYE DROPS OP) Place 1 drop into both eyes daily as needed (dry eyes).     . cholecalciferol (VITAMIN D3) 25 MCG (1000 UT) tablet Take 1,000 Units by mouth daily.    . diclofenac sodium (VOLTAREN) 1 % GEL Apply 2 g topically 2 (two) times daily as needed (knee pain).     . enalapril (VASOTEC) 10 MG tablet Take 10 mg by mouth daily.     . fish oil-omega-3 fatty acids 1000 MG capsule Take 1 g by mouth daily.    . hydrochlorothiazide (HYDRODIURIL) 25 MG tablet Take 25 mg by mouth daily.    Marland Kitchen linaclotide (LINZESS) 72 MCG capsule Take 1 capsule (72 mcg total) by mouth daily before breakfast. Take 30 min before breakfast. (Patient not taking: Reported on 11/28/2019) 30 capsule 3  . loratadine (CLARITIN) 10 MG  tablet Take 10 mg by mouth daily as needed for allergies.    . metFORMIN (GLUCOPHAGE-XR) 500 MG 24 hr tablet Take 500 mg by mouth at bedtime.     . Multiple Vitamin (MULTIVITAMIN WITH MINERALS) TABS Take 1 tablet by mouth every other day.     Marland Kitchen oxymetazoline (NASAL SPRAY 12 HOUR) 0.05 % nasal spray Place 2 sprays into the nose daily as needed for congestion.     . pravastatin (PRAVACHOL) 20 MG tablet Take 20 mg by mouth at bedtime.      No current facility-administered medications for this visit.     Past Surgical History:  Procedure Laterality Date  . BREAST EXCISIONAL BIOPSY Right   . COLONOSCOPY N/A 11/14/2018   Procedure: COLONOSCOPY;  Surgeon: Rogene Houston, MD;  [facility];  Service: Endoscopy;  Laterality: N/A;  1030  . COLONOSCOPY WITH PROPOFOL N/A 12/25/2019   Procedure: COLONOSCOPY WITH PROPOFOL;  Surgeon: Harvel Quale, MD;  Location: [facility];  Service: Gastroenterology;  Laterality: N/A;  1015  . POLYPECTOMY  11/14/2018   Procedure: POLYPECTOMY;  Surgeon: Rogene Houston, MD;  Location: [facility];  Service: Endoscopy;;  colon  . POLYPECTOMY  12/25/2019   Procedure: POLYPECTOMY;  Surgeon: Harvel Quale, MD;  Location: [facility];  Service: Gastroenterology;;     Allergies  Allergen Reactions  . Shellfish Allergy Anaphylaxis and Hives  . Lovastatin     myalgias      Family History  Problem Relation Age of Onset  . Hypertension Mother   . CVA Mother   . Coronary artery disease Father   . Prostate cancer Father   . Hypertension Sister   . Coronary artery disease Sister   . Breast cancer Maternal Aunt      Social History Ms. Hopf reports that she has never smoked. She has never used smokeless tobacco. Ms. Flath reports current alcohol use.   Review of Systems CONSTITUTIONAL: No weight loss, fever, chills, weakness or fatigue.  HEENT: Eyes: No visual loss, blurred vision, double vision or yellow  sclerae.No hearing loss, sneezing, congestion, runny nose or sore throat.  SKIN: No rash or itching.  CARDIOVASCULAR: per hpi RESPIRATORY: No shortness of breath, cough or sputum.  GASTROINTESTINAL: No anorexia, nausea, vomiting or diarrhea. No abdominal pain or blood.  GENITOURINARY: No burning on urination, no polyuria NEUROLOGICAL: No headache, dizziness, syncope, paralysis, ataxia, numbness or tingling in the extremities. No change in bowel or bladder control.  MUSCULOSKELETAL: No muscle, back pain, joint pain or stiffness.  LYMPHATICS: No enlarged nodes. No history of splenectomy.  PSYCHIATRIC: No history of depression or anxiety.  ENDOCRINOLOGIC: No reports of sweating, cold or heat intolerance. No polyuria or polydipsia.  Marland Kitchen   Physical Examination Today's Vitals   05/08/20 1035  BP: 136/74  Pulse: 77  SpO2: 98%  Weight: 213 lb (96.6 kg)  Height: 5' 7.5" (1.715 m)   Body mass index is 32.87 kg/m.  Gen: resting comfortably, no acute distress HEENT: no scleral icterus, pupils equal round and reactive, no palptable cervical adenopathy,  CV: RRR, no m/rg,, no jvd Resp: Clear to auscultation bilaterally GI: abdomen is soft, non-tender, non-distended, normal bowel sounds, no hepatosplenomegaly MSK: extremities are warm, no edema.  Skin: warm, no rash Neuro:  no focal deficits Psych: appropriate affect   Diagnostic Studies 04/2015 echo Study Conclusions   - Left ventricle: The cavity size was normal. There was focal basal  hypertrophy. Systolic function was normal. The estimated ejection  fraction was in the range of 55% to 60%. Doppler parameters are  consistent with abnormal left ventricular relaxation (grade 1  diastolic dysfunction).  - Aortic valve: Mildly calcified annulus. Trileaflet; normal  thickness leaflets. Valve area (VTI): 3.01 cm^2. Valve area  (Vmax): 2.92 cm^2.  - Atrial septum: No defect or patent foramen ovale was identified.  -  Technically adequate study.       Assessment and Plan   1. Preoperative evaluation - considerint knee replacement - cannot assess true exercise capacity by history, she is significantly limited by chronic knee pain - reports some DOE. Occasional nonspecific chest tightness - would plan on lexiscan to further risk stratify  2. LE edema/SOB - will repeat her echo   Surgical clearance pending stress and echo findings, f/u pending testing results   05/28/20 Addendum Normal stress test. The combine findings of her stress test and echo show that her heart is in goodshape, ok to proceed with knee replacement from our standpoint. Please send my addended note to her surgeon     Zandra Abts MD   Arnoldo Lenis, M.D.

## 2020-05-25 ENCOUNTER — Other Ambulatory Visit: Payer: Self-pay | Admitting: *Deleted

## 2020-05-25 NOTE — Patient Outreach (Signed)
[facility]) Care Management  [place]  05/25/2020   CRYTAL PENSINGER [birth year] [id number]  Subjective: Successful telephone outreach call to patient. HIPAA identifiers obtained. Patient states she is getting cleared to have knee replacement surgery. She has an upcoming [place] and echo scheduled by cardiology. Patient explained that she would like to lose 20 pounds by eating healthier and being more active. She does want to increase her physical activity but is limited due to her knee pain. Nurse discussed chair exercises and will send patient chair exercise printouts as well as diet and weigh loss tip education. Patient explained that she is taking her blood pressure 2-3 times weekly with her values being a little higher than usual. Her ranges are typically in systolic 979 to 892 range but occasionally go to the 119'E and diastolic ranges are 17'E to 80's. Patient discussed that losing weight would help to decrease her B/P and would improve her overall health. She is adhering to a low sodium diet and she reports mostly making healthy food choices.  Patient states she is well supported by her husband and her home environment is safe. Patient did not have any further questions or concerns today and did confirm that the patient has this nurse's contact number to call her if needed.   Encounter Medications:  Outpatient Encounter Medications as of 05/25/2020  Medication Sig  . acetaminophen (TYLENOL) 500 MG tablet Take 500 mg by mouth every 6 (six) hours as needed for moderate pain or headache.  . allopurinol (ZYLOPRIM) 100 MG tablet Take 100 mg by mouth daily.  Marland Kitchen aspirin EC 81 MG tablet Take 1 tablet (81 mg total) by mouth daily.  . Carboxymethylcellul-Glycerin (LUBRICATING EYE DROPS OP) Place 1 drop into both eyes daily as needed (dry eyes).   . cholecalciferol (VITAMIN D3) 25 MCG (1000 UT) tablet Take 1,000 Units by mouth daily.  . diclofenac sodium (VOLTAREN) 1 % GEL  Apply 2 g topically 2 (two) times daily as needed (knee pain).   . enalapril (VASOTEC) 10 MG tablet Take 10 mg by mouth daily.   . fish oil-omega-3 fatty acids 1000 MG capsule Take 1 g by mouth daily.  . hydrochlorothiazide (HYDRODIURIL) 25 MG tablet Take 25 mg by mouth daily.  Marland Kitchen linaclotide (LINZESS) 72 MCG capsule Take 1 capsule (72 mcg total) by mouth daily before breakfast. Take 30 min before breakfast.  . loratadine (CLARITIN) 10 MG tablet Take 10 mg by mouth daily as needed for allergies.  . metFORMIN (GLUCOPHAGE-XR) 500 MG 24 hr tablet Take 500 mg by mouth at bedtime.   . Multiple Vitamin (MULTIVITAMIN WITH MINERALS) TABS Take 1 tablet by mouth every other day.   Marland Kitchen oxymetazoline (AFRIN) 0.05 % nasal spray Place 2 sprays into the nose daily as needed for congestion.   . pravastatin (PRAVACHOL) 20 MG tablet Take 20 mg by mouth at bedtime.    No facility-administered encounter medications on file as of 05/25/2020.    Functional Status:  In your present state of health, do you have any difficulty performing the following activities: 07/18/2019  Hearing? N  Vision? N  Difficulty concentrating or making decisions? N  Walking or climbing stairs? N  Dressing or bathing? N  Doing errands, shopping? N  Preparing Food and eating ? N  Using the Toilet? N  In the past six months, have you accidently leaked urine? N  Do you have problems with loss of bowel control? N  Managing your Medications? N  Managing your Finances? N  Housekeeping or managing your Housekeeping? N  Some recent data might be hidden    Fall/Depression Screening: Fall Risk  11/28/2019 09/27/2019 07/18/2019  Falls in the past year? 1 1 1   Number falls in past yr: 1 1 0  Injury with Fall? 0 0 0  Risk for fall due to : History of fall(s) (No Data) History of fall(s)  Risk for fall due to: Comment - 2 falls per pt. both accidents and she typically feels steady on her feet -  Follow up Falls prevention discussed;Education  provided;Falls evaluation completed Falls prevention discussed;Education provided;Falls evaluation completed Falls prevention discussed;Education provided;Falls evaluation completed   PHQ 2/9 Scores 11/28/2019 07/18/2019  PHQ - 2 Score 0 0    Assessment:  Goals Addressed   None    Plan: RN Health Coach will send PCP today's assessment note, will send patient a calendar booklet, chair exercise printouts, healthy diet and weight loss tips education, will call patient within the month of May. Follow-up:  Patient agrees to Care Plan and Follow-up.   [name] RN, BSN [place] [phone] [email or web address]

## 2020-05-25 NOTE — Patient Instructions (Signed)
Goals Addressed            This Visit's Progress   . [facility]) Track and Manage My Blood Pressure-Hypertension       Timeframe:  Long-Range Goal Priority:  High Start Date:                             Expected End Date:                       Follow Up Date 09/10/20    - check blood pressure daily - choose a place to take my blood pressure (home, clinic or office, retail store) - write blood pressure results in a log or diary    Why is this important?    You won't feel high blood pressure, but it can still hurt your blood vessels.   High blood pressure can cause heart or kidney problems. It can also cause a stroke.   Making lifestyle changes like losing a little weight or eating less salt will help.   Checking your blood pressure at home and at different times of the day can help to control blood pressure.   If the doctor prescribes medicine remember to take it the way the doctor ordered.   Call the office if you cannot afford the medicine or if there are questions about it.     Notes: Patient states she is taking her B/P 2-3 times weekly and has not been recording the values due to being away from home currently. Patient explained she will be back home the first of the year, plans to start to take her B/P daily, and she will record her values. Nurse will send patient a calendar booklet for her to record her B/P values.   Updated 05/25/20: Patient continues to monitor her B/P 2-3 times weekly. She reports that she did not receive the calendar booklet. Nurse will send calendar booklet today. Patient states she does want to increase her physical activity and nurse will send chair exercise printouts. She is adhering to a low sodium diet.    . COMPLETED: Patient will verbalized B/P  maintained < 145/90 within the next 90 days       [place] (see longtitudinal plan of care for additional care plan information)  Objective:  . Last practice recorded BP readings:  BP Readings from  Last 3 Encounters:  11/14/18 (!) 102/48  09/25/18 (!) 142/73  04/10/18 130/89 .   Marland Kitchen Most recent eGFR/CrCl: No results found for: EGFR  No components found for: CRCL  Current Barriers:  Marland Kitchen Knowledge deficit related to self care management of hypertension  Case Manager Clinical Goal(s):  Marland Kitchen Over the next 90 days, patient will verbalize understanding of plan for hypertension management . Over the next 90 days, patient will attend all scheduled medical appointments:   . Over the next 90 days, patient will demonstrate improved adherence to prescribed treatment plan for hypertension as evidenced by taking all medications as prescribed, monitoring and recording blood pressure as directed, adhering to low sodium/DASH diet . Over the next 90 days, patient will verbalize basic understanding of hypertension disease process and self health management plan as evidenced by patient starting to take her B/P daily and recording values.  Interventions:  . Evaluation of current treatment plan related to hypertension self management and patient's adherence to plan as established by provider. . Reviewed medications with patient and discussed importance  of compliance . Advised patient, providing education and rationale, to monitor blood pressure daily and record, calling PCP for findings outside established parameters.  . Provided education regarding s/s of DASH diet/Low salt diet . Provided education regarding increasing physical activity  Patient Self Care Activities:  . Self administers medications as prescribed . Attends all scheduled provider appointments . Adheres to a low sodium diet/DASH diet . Increase physical activity as tolerated . Patient states that she will begin to take her B/P daily and record values.   Please see past updates related to this goal by clicking on the "Past Updates" button in the selected goal    Updated: 03/04/20 Resolved due to duplicate goals.    . Weight (lb) < 208 lb  (94.3 kg)       Timeframe:  Long-Range Goal Priority:  Medium Start Date: 05/25/20                            Expected End Date: 05/25/21                    Follow Up Date: 09/10/20  -Discussed using portion control -Discussed increasing physical activity by doing chair exercises -Encourage patient to continue to make healthy food choices -discussed setting goals of losing 5 pounds and then 5 more pounds until she reaches her goal of losing 20 pounds -Encouraged patient to continue to try to drink 64 ounces of water daily  Notes: Nurse will send patient planning healthy meals booklet, chair exercise printouts, and weight loss tips education.

## 2020-05-27 ENCOUNTER — Other Ambulatory Visit (HOSPITAL_COMMUNITY): Payer: Medicare HMO

## 2020-05-28 ENCOUNTER — Ambulatory Visit (HOSPITAL_BASED_OUTPATIENT_CLINIC_OR_DEPARTMENT_OTHER)
Admission: RE | Admit: 2020-05-28 | Discharge: 2020-05-28 | Disposition: A | Discharge status: Home / Self Care | Payer: Medicare HMO | Source: Ambulatory Visit | Attending: Cardiology | Admitting: Cardiology

## 2020-05-28 ENCOUNTER — Encounter (HOSPITAL_COMMUNITY)
Admission: RE | Admit: 2020-05-28 | Discharge: 2020-05-28 | Disposition: A | Discharge status: Home / Self Care | Payer: Medicare HMO | Source: Ambulatory Visit | Attending: Cardiology | Admitting: Cardiology

## 2020-05-28 ENCOUNTER — Encounter (HOSPITAL_BASED_OUTPATIENT_CLINIC_OR_DEPARTMENT_OTHER)
Admission: RE | Admit: 2020-05-28 | Discharge: 2020-05-28 | Disposition: A | Discharge status: Home / Self Care | Payer: Medicare HMO | Source: Ambulatory Visit | Attending: Cardiology | Admitting: Cardiology

## 2020-05-28 ENCOUNTER — Other Ambulatory Visit: Payer: Self-pay

## 2020-05-28 ENCOUNTER — Encounter (HOSPITAL_COMMUNITY): Payer: Self-pay

## 2020-05-28 DIAGNOSIS — R0602 Shortness of breath: Secondary | ICD-10-CM

## 2020-05-28 HISTORY — DX: Type 2 diabetes mellitus without complications: E11.9

## 2020-05-28 LAB — NM MYOCAR MULTI W/SPECT W/WALL MOTION / EF
LV dias vol: 52 mL (ref 46–106)
LV sys vol: 10 mL
Peak HR: 109 {beats}/min
RATE: 0.42
Rest HR: 77 {beats}/min
SDS: 0
SRS: 5
SSS: 5
TID: 1.03

## 2020-05-28 LAB — ECHOCARDIOGRAM COMPLETE
Area-P 1/2: 2.16 cm2
S' Lateral: 1.9 cm

## 2020-05-28 MED ORDER — TECHNETIUM TC 99M TETROFOSMIN IV KIT
10.0000 | PACK | Freq: Once | INTRAVENOUS | Status: AC | PRN
  Administered 2020-05-28: 10 via INTRAVENOUS

## 2020-05-28 MED ORDER — SODIUM CHLORIDE FLUSH 0.9 % IV SOLN
INTRAVENOUS | Status: AC
  Administered 2020-05-28: 10 mL via INTRAVENOUS
  Filled 2020-05-28: qty 10

## 2020-05-28 MED ORDER — REGADENOSON 0.4 MG/5ML IV SOLN
INTRAVENOUS | Status: AC
  Administered 2020-05-28: 0.4 mg via INTRAVENOUS
  Filled 2020-05-28: qty 5

## 2020-05-28 MED ORDER — TECHNETIUM TC 99M TETROFOSMIN IV KIT
30.0000 | PACK | Freq: Once | INTRAVENOUS | Status: AC | PRN
  Administered 2020-05-28: 32.9 via INTRAVENOUS

## 2020-05-28 NOTE — Progress Notes (Deleted)
*  PRELIMINARY RESULTS* Echocardiogram 2D Echocardiogram has been performed.  Samuel Germany 05/28/2020, 11:13 AM

## 2020-05-29 ENCOUNTER — Telehealth: Payer: Self-pay | Admitting: *Deleted

## 2020-05-29 NOTE — Telephone Encounter (Signed)
Pt voiced understanding of both test - routed LOV to Dr Noemi Chapel     Echo shows normal heart function    Zandra Abts MD

## 2020-05-29 NOTE — Telephone Encounter (Signed)
-----   Message from Arnoldo Lenis, MD sent at 05/29/2020  8:56 AM EDT ----- Normal stress test. The combine findings of her stress test and echo show that her heart is in goodshape, ok to proceed with knee replacement from our standpoint. Please send my addended note to her surgeon  Zandra Abts MD

## 2020-06-02 ENCOUNTER — Telehealth: Payer: Self-pay | Admitting: *Deleted

## 2020-06-02 DIAGNOSIS — E785 Hyperlipidemia, unspecified: Secondary | ICD-10-CM | POA: Diagnosis not present

## 2020-06-02 DIAGNOSIS — I10 Essential (primary) hypertension: Secondary | ICD-10-CM | POA: Diagnosis not present

## 2020-06-02 DIAGNOSIS — M1710 Unilateral primary osteoarthritis, unspecified knee: Secondary | ICD-10-CM | POA: Diagnosis not present

## 2020-06-02 DIAGNOSIS — M1990 Unspecified osteoarthritis, unspecified site: Secondary | ICD-10-CM | POA: Diagnosis not present

## 2020-06-02 DIAGNOSIS — G8929 Other chronic pain: Secondary | ICD-10-CM | POA: Diagnosis not present

## 2020-06-02 DIAGNOSIS — N183 Chronic kidney disease, stage 3 unspecified: Secondary | ICD-10-CM | POA: Diagnosis not present

## 2020-06-02 DIAGNOSIS — E1169 Type 2 diabetes mellitus with other specified complication: Secondary | ICD-10-CM | POA: Diagnosis not present

## 2020-06-02 NOTE — Telephone Encounter (Signed)
-----   Message from Arnoldo Lenis, MD sent at 06/02/2020  3:59 PM EDT ----- F/u just as needed   Terri Abts MD ----- Message ----- From: Massie Maroon, CMA Sent: 05/29/2020  10:39 AM EDT To: Arnoldo Lenis, MD  Does this pt need any f/u ?  Cindia Hustead

## 2020-06-25 DIAGNOSIS — E1169 Type 2 diabetes mellitus with other specified complication: Secondary | ICD-10-CM | POA: Diagnosis not present

## 2020-06-25 DIAGNOSIS — Z7984 Long term (current) use of oral hypoglycemic drugs: Secondary | ICD-10-CM | POA: Diagnosis not present

## 2020-06-25 DIAGNOSIS — I10 Essential (primary) hypertension: Secondary | ICD-10-CM | POA: Diagnosis not present

## 2020-06-25 DIAGNOSIS — E785 Hyperlipidemia, unspecified: Secondary | ICD-10-CM | POA: Diagnosis not present

## 2020-07-21 DIAGNOSIS — R791 Abnormal coagulation profile: Secondary | ICD-10-CM | POA: Diagnosis not present

## 2020-07-21 DIAGNOSIS — M1711 Unilateral primary osteoarthritis, right knee: Secondary | ICD-10-CM | POA: Diagnosis not present

## 2020-07-21 DIAGNOSIS — Z01812 Encounter for preprocedural laboratory examination: Secondary | ICD-10-CM | POA: Diagnosis not present

## 2020-07-21 DIAGNOSIS — M25561 Pain in right knee: Secondary | ICD-10-CM | POA: Diagnosis not present

## 2020-07-24 DIAGNOSIS — E785 Hyperlipidemia, unspecified: Secondary | ICD-10-CM | POA: Diagnosis not present

## 2020-07-24 DIAGNOSIS — G8929 Other chronic pain: Secondary | ICD-10-CM | POA: Diagnosis not present

## 2020-07-24 DIAGNOSIS — N183 Chronic kidney disease, stage 3 unspecified: Secondary | ICD-10-CM | POA: Diagnosis not present

## 2020-07-24 DIAGNOSIS — M1710 Unilateral primary osteoarthritis, unspecified knee: Secondary | ICD-10-CM | POA: Diagnosis not present

## 2020-07-24 DIAGNOSIS — M1990 Unspecified osteoarthritis, unspecified site: Secondary | ICD-10-CM | POA: Diagnosis not present

## 2020-07-24 DIAGNOSIS — I10 Essential (primary) hypertension: Secondary | ICD-10-CM | POA: Diagnosis not present

## 2020-07-24 DIAGNOSIS — E1169 Type 2 diabetes mellitus with other specified complication: Secondary | ICD-10-CM | POA: Diagnosis not present

## 2020-07-27 DIAGNOSIS — M1711 Unilateral primary osteoarthritis, right knee: Secondary | ICD-10-CM | POA: Diagnosis not present

## 2020-07-27 DIAGNOSIS — R262 Difficulty in walking, not elsewhere classified: Secondary | ICD-10-CM | POA: Diagnosis not present

## 2020-07-27 DIAGNOSIS — M25661 Stiffness of right knee, not elsewhere classified: Secondary | ICD-10-CM | POA: Diagnosis not present

## 2020-07-27 DIAGNOSIS — M6281 Muscle weakness (generalized): Secondary | ICD-10-CM | POA: Diagnosis not present

## 2020-07-28 ENCOUNTER — Other Ambulatory Visit: Payer: Self-pay | Admitting: *Deleted

## 2020-07-28 NOTE — Patient Instructions (Addendum)
Goals Addressed            This Visit's Progress   . [facility]) Track and Manage My Blood Pressure-Hypertension       Timeframe:  Long-Range Goal Priority:  High Start Date: 03/04/20                            Expected End Date: 03/12/21                      Follow Up Date 11/10/20    - check blood pressure 3 times per week - choose a place to take my blood pressure (home, clinic or office, retail store) - write blood pressure results in a log or diary -Encouraged continuation of eating a low sodium, heart healthy diet -Encouraged continuation of doing chair exercises and Physical Therapy exercises for her knee pre and post surgery   Why is this important?    You won't feel high blood pressure, but it can still hurt your blood vessels.   High blood pressure can cause heart or kidney problems. It can also cause a stroke.   Making lifestyle changes like losing a little weight or eating less salt will help.   Checking your blood pressure at home and at different times of the day can help to control blood pressure.   If the doctor prescribes medicine remember to take it the way the doctor ordered.   Call the office if you cannot afford the medicine or if there are questions about it.     Notes: Patient states she is taking her B/P 2-3 times weekly and has not been recording the values due to being away from home currently. Patient explained she will be back home the first of the year, plans to start to take her B/P daily, and she will record her values. Nurse will send patient a calendar booklet for her to record her B/P values.   Updated 05/25/20: Patient continues to monitor her B/P 2-3 times weekly. She reports that she did not receive the calendar booklet. Nurse will send calendar booklet today. Patient states she does want to increase her physical activity and nurse will send chair exercise printouts. She is adhering to a low sodium diet.  Updated 07/28/20: Patient states that she did  receive the calendar booklet and chair exercise printouts. Patient is doing chair exercises, PT exercises for her knee, adhering to a low sodium diet, and she monitors her B/P and records the values routinely. Patient explains her goal is to recover from her upcoming knee replacement surgery and then work towards lowering her B/P.      Marland Kitchen Weight (lb) < 208 lb (94.3 kg)       Timeframe:  Long-Range Goal Priority:  Medium Start Date: 05/25/20                            Expected End Date: 05/25/21                    Follow Up Date: 11/10/20  -Discussed using portion control -Discussed increasing physical activity by doing chair exercises and PT exercises for her knee surgery -Encourage patient to continue to make healthy food choices -discussed setting goals of losing 5 pounds and then 5 more pounds until she reaches her goal of losing 20 pounds -Encouraged patient to continue to try to drink 64  ounces of water daily  Notes: Nurse will send patient planning healthy meals booklet, chair exercise printouts, and weight loss tips education.  Updated 07/28/20:  Patient states that she has loss a few pounds due to her eating healthier and increasing her physical activity. Patient reports she will have knee replacement surgery next week. Her focus is to recover from surgery first and then focus on losing weight.

## 2020-07-28 NOTE — Patient Outreach (Signed)
[facility]) Care Management  [place]  07/28/2020   ROWAN POLLMAN [birth year] [id number]  Subjective: Successful telephone outreach call to patient. HIPAA identifiers obtained. Patient states she is scheduled to have her knee surgery next week. She feels well supported by her surgical team and has begun doing pre-surgical PT knee exercises. Her plan is to recover from the knee surgery at her cousins house. Patient states she did receive the calendar booklet, weight loss education, and chair exercise printouts this nurse sent her previously. She is doing the chair exercises and states she has loss a few pounds due to her eating healthier. Patient explained that she is taking her blood pressure 2-3 times weekly with her values being a little higher than usual. Her ranges are typically in systolic 323 to 557 range but occasionally goes as high as 322 and her diastolic ranges are 02'R to 80's. She is adhering to a low sodium diet and plans to work towards lowering her B/P and losing more weight after she recovers from having knee surgery. Patient did not have any further questions or concerns today and did confirm that she has this nurse's contact number to call her if needed.   Encounter Medications:  Outpatient Encounter Medications as of 07/28/2020  Medication Sig  . acetaminophen (TYLENOL) 500 MG tablet Take 500 mg by mouth every 6 (six) hours as needed for moderate pain or headache.  . allopurinol (ZYLOPRIM) 100 MG tablet Take 100 mg by mouth daily.  Marland Kitchen aspirin EC 81 MG tablet Take 1 tablet (81 mg total) by mouth daily.  . Carboxymethylcellul-Glycerin (LUBRICATING EYE DROPS OP) Place 1 drop into both eyes daily as needed (dry eyes).   . cholecalciferol (VITAMIN D3) 25 MCG (1000 UT) tablet Take 1,000 Units by mouth daily.  . diclofenac sodium (VOLTAREN) 1 % GEL Apply 2 g topically 2 (two) times daily as needed (knee pain).   . enalapril (VASOTEC) 10 MG tablet Take 10 mg  by mouth daily.   . fish oil-omega-3 fatty acids 1000 MG capsule Take 1 g by mouth daily.  . hydrochlorothiazide (HYDRODIURIL) 25 MG tablet Take 25 mg by mouth daily.  Marland Kitchen linaclotide (LINZESS) 72 MCG capsule Take 1 capsule (72 mcg total) by mouth daily before breakfast. Take 30 min before breakfast.  . loratadine (CLARITIN) 10 MG tablet Take 10 mg by mouth daily as needed for allergies.  . metFORMIN (GLUCOPHAGE-XR) 500 MG 24 hr tablet Take 500 mg by mouth at bedtime.   . Multiple Vitamin (MULTIVITAMIN WITH MINERALS) TABS Take 1 tablet by mouth every other day.   Marland Kitchen oxymetazoline (AFRIN) 0.05 % nasal spray Place 2 sprays into the nose daily as needed for congestion.   . pravastatin (PRAVACHOL) 20 MG tablet Take 20 mg by mouth at bedtime.    No facility-administered encounter medications on file as of 07/28/2020.    Functional Status:  No flowsheet data found.  Fall/Depression Screening: Fall Risk  07/28/2020 05/25/2020 11/28/2019  Falls in the past year? 1 1 1   Number falls in past yr: 1 1 1   Injury with Fall? 0 0 0  Risk for fall due to : Impaired mobility;Impaired balance/gait;History of fall(s) Impaired mobility;Impaired balance/gait;History of fall(s) History of fall(s)  Risk for fall due to: Comment - - -  Follow up Falls prevention discussed;Education provided;Falls evaluation completed Falls evaluation completed;Education provided;Falls prevention discussed Falls prevention discussed;Education provided;Falls evaluation completed   PHQ 2/9 Scores 11/28/2019 07/18/2019  PHQ - 2 Score  0 0    Assessment:  Goals Addressed            This Visit's Progress   . [facility]) Track and Manage My Blood Pressure-Hypertension       Timeframe:  Long-Range Goal Priority:  High Start Date: 03/04/20                            Expected End Date: 03/12/21                      Follow Up Date 11/10/20    - check blood pressure 3 times per week - choose a place to take my blood pressure (home, clinic or  office, retail store) - write blood pressure results in a log or diary -Encouraged continuation of eating a low sodium, heart healthy diet -Encouraged continuation of doing chair exercises and Physical Therapy exercises for her knee pre and post surgery   Why is this important?    You won't feel high blood pressure, but it can still hurt your blood vessels.   High blood pressure can cause heart or kidney problems. It can also cause a stroke.   Making lifestyle changes like losing a little weight or eating less salt will help.   Checking your blood pressure at home and at different times of the day can help to control blood pressure.   If the doctor prescribes medicine remember to take it the way the doctor ordered.   Call the office if you cannot afford the medicine or if there are questions about it.     Notes: Patient states she is taking her B/P 2-3 times weekly and has not been recording the values due to being away from home currently. Patient explained she will be back home the first of the year, plans to start to take her B/P daily, and she will record her values. Nurse will send patient a calendar booklet for her to record her B/P values.   Updated 05/25/20: Patient continues to monitor her B/P 2-3 times weekly. She reports that she did not receive the calendar booklet. Nurse will send calendar booklet today. Patient states she does want to increase her physical activity and nurse will send chair exercise printouts. She is adhering to a low sodium diet.  Updated 07/28/20: Patient states that she did receive the calendar booklet and chair exercise printouts. Patient is doing chair exercises, PT exercises for her knee, adhering to a low sodium diet, and she monitors her B/P and records the values routinely. Patient explains her goal is to recover from her upcoming knee replacement surgery and then work towards lowering her B/P.      Marland Kitchen Weight (lb) < 208 lb (94.3 kg)       Timeframe:   Long-Range Goal Priority:  Medium Start Date: 05/25/20                            Expected End Date: 05/25/21                    Follow Up Date: 11/10/20  -Discussed using portion control -Discussed increasing physical activity by doing chair exercises and PT exercises for her knee surgery -Encourage patient to continue to make healthy food choices -discussed setting goals of losing 5 pounds and then 5 more pounds until she reaches her goal of losing 20 pounds -Encouraged  patient to continue to try to drink 64 ounces of water daily  Notes: Nurse will send patient planning healthy meals booklet, chair exercise printouts, and weight loss tips education.  Updated 07/28/20:  Patient states that she has loss a few pounds due to her eating healthier and increasing her physical activity. Patient reports she will have knee replacement surgery next week. Her focus is to recover from surgery first and then focus on losing weight.       Plan: RN Health Coach will call patient within the month of June. Follow-up:  Patient agrees to Care Plan and Follow-up.   [name] RN, BSN [place] [phone] [email or web address]

## 2020-08-05 DIAGNOSIS — M1711 Unilateral primary osteoarthritis, right knee: Secondary | ICD-10-CM | POA: Diagnosis not present

## 2020-08-05 DIAGNOSIS — G8918 Other acute postprocedural pain: Secondary | ICD-10-CM | POA: Diagnosis not present

## 2020-08-06 DIAGNOSIS — Z96651 Presence of right artificial knee joint: Secondary | ICD-10-CM | POA: Diagnosis not present

## 2020-08-06 DIAGNOSIS — M1711 Unilateral primary osteoarthritis, right knee: Secondary | ICD-10-CM | POA: Diagnosis not present

## 2020-08-07 DIAGNOSIS — M25661 Stiffness of right knee, not elsewhere classified: Secondary | ICD-10-CM | POA: Diagnosis not present

## 2020-08-07 DIAGNOSIS — M6281 Muscle weakness (generalized): Secondary | ICD-10-CM | POA: Diagnosis not present

## 2020-08-07 DIAGNOSIS — M1711 Unilateral primary osteoarthritis, right knee: Secondary | ICD-10-CM | POA: Diagnosis not present

## 2020-08-07 DIAGNOSIS — R262 Difficulty in walking, not elsewhere classified: Secondary | ICD-10-CM | POA: Diagnosis not present

## 2020-08-11 DIAGNOSIS — R262 Difficulty in walking, not elsewhere classified: Secondary | ICD-10-CM | POA: Diagnosis not present

## 2020-08-11 DIAGNOSIS — M25661 Stiffness of right knee, not elsewhere classified: Secondary | ICD-10-CM | POA: Diagnosis not present

## 2020-08-11 DIAGNOSIS — M6281 Muscle weakness (generalized): Secondary | ICD-10-CM | POA: Diagnosis not present

## 2020-08-11 DIAGNOSIS — M1711 Unilateral primary osteoarthritis, right knee: Secondary | ICD-10-CM | POA: Diagnosis not present

## 2020-08-12 DIAGNOSIS — M1711 Unilateral primary osteoarthritis, right knee: Secondary | ICD-10-CM | POA: Diagnosis not present

## 2020-08-12 DIAGNOSIS — Z96651 Presence of right artificial knee joint: Secondary | ICD-10-CM | POA: Diagnosis not present

## 2020-08-13 DIAGNOSIS — M6281 Muscle weakness (generalized): Secondary | ICD-10-CM | POA: Diagnosis not present

## 2020-08-13 DIAGNOSIS — R262 Difficulty in walking, not elsewhere classified: Secondary | ICD-10-CM | POA: Diagnosis not present

## 2020-08-13 DIAGNOSIS — M1711 Unilateral primary osteoarthritis, right knee: Secondary | ICD-10-CM | POA: Diagnosis not present

## 2020-08-13 DIAGNOSIS — M25661 Stiffness of right knee, not elsewhere classified: Secondary | ICD-10-CM | POA: Diagnosis not present

## 2020-08-18 DIAGNOSIS — M6281 Muscle weakness (generalized): Secondary | ICD-10-CM | POA: Diagnosis not present

## 2020-08-18 DIAGNOSIS — M25661 Stiffness of right knee, not elsewhere classified: Secondary | ICD-10-CM | POA: Diagnosis not present

## 2020-08-18 DIAGNOSIS — R262 Difficulty in walking, not elsewhere classified: Secondary | ICD-10-CM | POA: Diagnosis not present

## 2020-08-18 DIAGNOSIS — M1711 Unilateral primary osteoarthritis, right knee: Secondary | ICD-10-CM | POA: Diagnosis not present

## 2020-08-20 DIAGNOSIS — M25661 Stiffness of right knee, not elsewhere classified: Secondary | ICD-10-CM | POA: Diagnosis not present

## 2020-08-20 DIAGNOSIS — M6281 Muscle weakness (generalized): Secondary | ICD-10-CM | POA: Diagnosis not present

## 2020-08-20 DIAGNOSIS — M1711 Unilateral primary osteoarthritis, right knee: Secondary | ICD-10-CM | POA: Diagnosis not present

## 2020-08-20 DIAGNOSIS — R262 Difficulty in walking, not elsewhere classified: Secondary | ICD-10-CM | POA: Diagnosis not present

## 2020-08-25 ENCOUNTER — Other Ambulatory Visit: Payer: Self-pay | Admitting: *Deleted

## 2020-08-25 DIAGNOSIS — M1711 Unilateral primary osteoarthritis, right knee: Secondary | ICD-10-CM | POA: Diagnosis not present

## 2020-08-25 DIAGNOSIS — M25661 Stiffness of right knee, not elsewhere classified: Secondary | ICD-10-CM | POA: Diagnosis not present

## 2020-08-25 DIAGNOSIS — R262 Difficulty in walking, not elsewhere classified: Secondary | ICD-10-CM | POA: Diagnosis not present

## 2020-08-25 DIAGNOSIS — M6281 Muscle weakness (generalized): Secondary | ICD-10-CM | POA: Diagnosis not present

## 2020-08-25 NOTE — Patient Outreach (Signed)
[facility]) Care Management  08/25/2020  Terri Miranda [facility] 16-Jul-1945 [id number]  Successful telephone outreach call to patient. HIPAA identifiers obtained. Nurse called patient to ensure she was doing well after her knee replacement surgery. Patient explained that she is staying with her cousin who is an excellent support. She feels that the knee surgery was very successful. Patient states that her pain is minimum, her incision is healing well, she goes to PT 2 times weekly, she is doing the PT exercises at home, she is using a CPM device at home several hours a day which has made a huge difference in her knee mobility, and she has all the DME she needs to recover from her surgery. Patient reports that she had her post surgical visit with Dr. Noemi Chapel and she has another follow-up in about 2 weeks. Patient admits that she has not taken her B/P routinely since her surgery and adds she plans to start to take it daily and record her values soon stating that she will get back on top of  controlling her health and wellness. Patient did not have any further questions or concerns today and did confirm that she has this nurse's contact number to call her if needed.   Plan: RN Health Coach will call patient within the month of August and patient agrees to future outreach calls.   [name] RN, BSN [place] [phone] [email or web address]

## 2020-08-27 DIAGNOSIS — M6281 Muscle weakness (generalized): Secondary | ICD-10-CM | POA: Diagnosis not present

## 2020-08-27 DIAGNOSIS — M25661 Stiffness of right knee, not elsewhere classified: Secondary | ICD-10-CM | POA: Diagnosis not present

## 2020-08-27 DIAGNOSIS — M1711 Unilateral primary osteoarthritis, right knee: Secondary | ICD-10-CM | POA: Diagnosis not present

## 2020-08-27 DIAGNOSIS — R262 Difficulty in walking, not elsewhere classified: Secondary | ICD-10-CM | POA: Diagnosis not present

## 2020-09-01 DIAGNOSIS — M25661 Stiffness of right knee, not elsewhere classified: Secondary | ICD-10-CM | POA: Diagnosis not present

## 2020-09-01 DIAGNOSIS — M6281 Muscle weakness (generalized): Secondary | ICD-10-CM | POA: Diagnosis not present

## 2020-09-01 DIAGNOSIS — M1711 Unilateral primary osteoarthritis, right knee: Secondary | ICD-10-CM | POA: Diagnosis not present

## 2020-09-01 DIAGNOSIS — R262 Difficulty in walking, not elsewhere classified: Secondary | ICD-10-CM | POA: Diagnosis not present

## 2020-09-02 DIAGNOSIS — N183 Chronic kidney disease, stage 3 unspecified: Secondary | ICD-10-CM | POA: Diagnosis not present

## 2020-09-02 DIAGNOSIS — I10 Essential (primary) hypertension: Secondary | ICD-10-CM | POA: Diagnosis not present

## 2020-09-02 DIAGNOSIS — E785 Hyperlipidemia, unspecified: Secondary | ICD-10-CM | POA: Diagnosis not present

## 2020-09-02 DIAGNOSIS — M1990 Unspecified osteoarthritis, unspecified site: Secondary | ICD-10-CM | POA: Diagnosis not present

## 2020-09-02 DIAGNOSIS — M1710 Unilateral primary osteoarthritis, unspecified knee: Secondary | ICD-10-CM | POA: Diagnosis not present

## 2020-09-02 DIAGNOSIS — G8929 Other chronic pain: Secondary | ICD-10-CM | POA: Diagnosis not present

## 2020-09-02 DIAGNOSIS — E1169 Type 2 diabetes mellitus with other specified complication: Secondary | ICD-10-CM | POA: Diagnosis not present

## 2020-09-03 DIAGNOSIS — M6281 Muscle weakness (generalized): Secondary | ICD-10-CM | POA: Diagnosis not present

## 2020-09-03 DIAGNOSIS — M1711 Unilateral primary osteoarthritis, right knee: Secondary | ICD-10-CM | POA: Diagnosis not present

## 2020-09-03 DIAGNOSIS — M25661 Stiffness of right knee, not elsewhere classified: Secondary | ICD-10-CM | POA: Diagnosis not present

## 2020-09-03 DIAGNOSIS — R262 Difficulty in walking, not elsewhere classified: Secondary | ICD-10-CM | POA: Diagnosis not present

## 2020-09-10 DIAGNOSIS — M6281 Muscle weakness (generalized): Secondary | ICD-10-CM | POA: Diagnosis not present

## 2020-09-10 DIAGNOSIS — R262 Difficulty in walking, not elsewhere classified: Secondary | ICD-10-CM | POA: Diagnosis not present

## 2020-09-10 DIAGNOSIS — M25661 Stiffness of right knee, not elsewhere classified: Secondary | ICD-10-CM | POA: Diagnosis not present

## 2020-09-10 DIAGNOSIS — M1711 Unilateral primary osteoarthritis, right knee: Secondary | ICD-10-CM | POA: Diagnosis not present

## 2020-09-17 DIAGNOSIS — R262 Difficulty in walking, not elsewhere classified: Secondary | ICD-10-CM | POA: Diagnosis not present

## 2020-09-17 DIAGNOSIS — M1711 Unilateral primary osteoarthritis, right knee: Secondary | ICD-10-CM | POA: Diagnosis not present

## 2020-09-17 DIAGNOSIS — M25661 Stiffness of right knee, not elsewhere classified: Secondary | ICD-10-CM | POA: Diagnosis not present

## 2020-09-17 DIAGNOSIS — M6281 Muscle weakness (generalized): Secondary | ICD-10-CM | POA: Diagnosis not present

## 2020-09-22 DIAGNOSIS — R262 Difficulty in walking, not elsewhere classified: Secondary | ICD-10-CM | POA: Diagnosis not present

## 2020-09-22 DIAGNOSIS — M6281 Muscle weakness (generalized): Secondary | ICD-10-CM | POA: Diagnosis not present

## 2020-09-22 DIAGNOSIS — M25661 Stiffness of right knee, not elsewhere classified: Secondary | ICD-10-CM | POA: Diagnosis not present

## 2020-09-22 DIAGNOSIS — M1711 Unilateral primary osteoarthritis, right knee: Secondary | ICD-10-CM | POA: Diagnosis not present

## 2020-10-27 ENCOUNTER — Other Ambulatory Visit: Payer: Self-pay | Admitting: *Deleted

## 2020-10-27 NOTE — Patient Outreach (Signed)
[facility]) Care Management  10/27/2020  Terri Miranda 1945-12-23 [id number]  Outreach attempt to patient. No answer and unable to leave voicemail message due to phone picked up and stated the call could not be completed at this time please hang up and try again later. Nurse called patient's mobile phone and received a recording that stated her VM has not been set up.   Plan: RN Health Coach will call patient within the month of  September.  [name] RN, BSN [place] [phone] [email or web address]

## 2020-10-29 DIAGNOSIS — M1990 Unspecified osteoarthritis, unspecified site: Secondary | ICD-10-CM | POA: Diagnosis not present

## 2020-10-29 DIAGNOSIS — N183 Chronic kidney disease, stage 3 unspecified: Secondary | ICD-10-CM | POA: Diagnosis not present

## 2020-10-29 DIAGNOSIS — E1169 Type 2 diabetes mellitus with other specified complication: Secondary | ICD-10-CM | POA: Diagnosis not present

## 2020-10-29 DIAGNOSIS — M1710 Unilateral primary osteoarthritis, unspecified knee: Secondary | ICD-10-CM | POA: Diagnosis not present

## 2020-10-29 DIAGNOSIS — E785 Hyperlipidemia, unspecified: Secondary | ICD-10-CM | POA: Diagnosis not present

## 2020-10-29 DIAGNOSIS — I10 Essential (primary) hypertension: Secondary | ICD-10-CM | POA: Diagnosis not present

## 2020-10-29 DIAGNOSIS — G8929 Other chronic pain: Secondary | ICD-10-CM | POA: Diagnosis not present

## 2020-11-27 ENCOUNTER — Other Ambulatory Visit: Payer: Self-pay | Admitting: Internal Medicine

## 2020-11-27 DIAGNOSIS — Z1231 Encounter for screening mammogram for malignant neoplasm of breast: Secondary | ICD-10-CM

## 2020-12-01 ENCOUNTER — Other Ambulatory Visit: Payer: Self-pay | Admitting: *Deleted

## 2020-12-01 NOTE — Patient Outreach (Signed)
[facility]) Care Management  12/01/2020  Terri Miranda Dec 09, 1945 [id number]  Outreach attempt to patient. No answer from patient and unable to leave voicemail message due to phone rang until it picked up and stated that you have reached the remote system. It did not give an option to leave a VM.Marland Kitchen  Plan: RN Health Coach will call patient within the month of October.  [name] RN, BSN [name] [phone] [name]

## 2020-12-29 ENCOUNTER — Other Ambulatory Visit: Payer: Self-pay | Admitting: *Deleted

## 2020-12-29 NOTE — Patient Instructions (Signed)
Goals Addressed             This Visit's Progress    [facility]) DIET - INCREASE WATER INTAKE       Timeframe:  Long-Range Goal Priority:  Medium Start Date: 12/29/20                             Expected End Date: 01/10/22 Follow Up Date: 04/12/21  -Encouraged patient to drink 4 bottles of water daily  Notes: Patient reports that she does want to increase the amount of water she drinks daily to ensure she is stay well hydrated. Patient states she will begin to drink 4 bottles of water a day.                           [facility]) Track and Manage My Blood Pressure-Hypertension       Timeframe:  Long-Range Goal Priority:  High Start Date: 03/04/20                            Expected End Date: 09/10/21                     Follow Up Date 04/12/21 -Discussed monitoring B/P at least 3 times weekly - write blood pressure results in a log or diary -Encouraged continuation of eating a low sodium, heart healthy diet -Encouraged continuation of doing Physical Therapy exercises, walk, and staying physically active maintaining home   Why is this important?   You won't feel high blood pressure, but it can still hurt your blood vessels.  High blood pressure can cause heart or kidney problems. It can also cause a stroke.  Making lifestyle changes like losing a little weight or eating less salt will help.  Checking your blood pressure at home and at different times of the day can help to control blood pressure.  If the doctor prescribes medicine remember to take it the way the doctor ordered.  Call the office if you cannot afford the medicine or if there are questions about it.     Notes: 03/04/20: Patient states she is taking her B/P 2-3 times weekly and has not been recording the values due to being away from home currently. Patient explained she will be back home the first of the year, plans to start to take her B/P daily, and she will record her values. Nurse will send patient a calendar booklet for her  to record her B/P values.   Updated 05/25/20: Patient continues to monitor her B/P 2-3 times weekly. She reports that she did not receive the calendar booklet. Nurse will send calendar booklet today. Patient states she does want to increase her physical activity and nurse will send chair exercise printouts. She is adhering to a low sodium diet.  Updated 07/28/20: Patient states that she did receive the calendar booklet and chair exercise printouts. Patient is doing chair exercises, PT exercises for her knee, adhering to a low sodium diet, and she monitors her B/P and records the values routinely. Patient explains her goal is to recover from her upcoming knee replacement surgery and then work towards lowering her B/P.  Updated 12/29/20: Patient reports taking her B/P a few times a week. Patient states her ranges are around 253 systolic and 80 or below diastolic. She explains that she will begin to take her B/P 1-2  times daily and record the values into the calendar booklet to bring to her upcoming PCP appointment. Patient states she does want to work towards lowering her B/P and she will discuss medication options with her provider. Patient continues to eat healthy, walk and do PT exercises routinely, and limit the sodium in her diet.

## 2020-12-29 NOTE — Patient Outreach (Signed)
[facility]) Care Management  [place]  12/29/2020   Terri Miranda 24-May-1945 [id number]  Subjective: Successful telephone outreach call to patient. HIPAA identifiers obtained. Patient states that she has recovered well from her knee surgery. Patient reports taking her B/P a few times a week. Patient states her ranges are around 664 systolic and 80 or below diastolic. She explains that she will begin to take her B/P 1-2 times daily and record the values into the calendar booklet to bring to her upcoming PCP appointment. Patient states she does want to work towards lowering her B/P and she will discuss medication options with her provider. Patient continues to eat healthy, walk and do PT exercises routinely, and limit the sodium in her diet. Patient did not have any further questions or concerns today and did confirm that she has this nurse's contact number to call her if needed.   Encounter Medications:  Outpatient Encounter Medications as of 12/29/2020  Medication Sig Note   acetaminophen (TYLENOL) 500 MG tablet Take 500 mg by mouth every 6 (six) hours as needed for moderate pain or headache.    allopurinol (ZYLOPRIM) 100 MG tablet Take 100 mg by mouth daily.    aspirin EC 81 MG tablet Take 1 tablet (81 mg total) by mouth daily.    Carboxymethylcellul-Glycerin (LUBRICATING EYE DROPS OP) Place 1 drop into both eyes daily as needed (dry eyes).     cholecalciferol (VITAMIN D3) 25 MCG (1000 UT) tablet Take 1,000 Units by mouth daily.    diclofenac sodium (VOLTAREN) 1 % GEL Apply 2 g topically 2 (two) times daily as needed (knee pain).     enalapril (VASOTEC) 10 MG tablet Take 10 mg by mouth daily.     fish oil-omega-3 fatty acids 1000 MG capsule Take 1 g by mouth daily.    hydrochlorothiazide (HYDRODIURIL) 25 MG tablet Take 25 mg by mouth daily.    linaclotide (LINZESS) 72 MCG capsule Take 1 capsule (72 mcg total) by mouth daily before breakfast. Take 30 min before  breakfast.    loratadine (CLARITIN) 10 MG tablet Take 10 mg by mouth daily as needed for allergies.    metFORMIN (GLUCOPHAGE-XR) 500 MG 24 hr tablet Take 500 mg by mouth at bedtime.     Multiple Vitamin (MULTIVITAMIN WITH MINERALS) TABS Take 1 tablet by mouth every other day.     pravastatin (PRAVACHOL) 20 MG tablet Take 20 mg by mouth at bedtime.     oxymetazoline (AFRIN) 0.05 % nasal spray Place 2 sprays into the nose daily as needed for congestion.  (Patient not taking: Reported on 12/29/2020) 12/29/2020: Reports not taking   No facility-administered encounter medications on file as of 12/29/2020.    Functional Status:  No flowsheet data found.  Fall/Depression Screening: Fall Risk  12/29/2020 07/28/2020 05/25/2020  Falls in the past year? 1 1 1   Number falls in past yr: 0 1 1  Injury with Fall? 0 0 0  Risk for fall due to : History of fall(s);Impaired mobility Impaired mobility;Impaired balance/gait;History of fall(s) Impaired mobility;Impaired balance/gait;History of fall(s)  Risk for fall due to: Comment - - -  Follow up Falls prevention discussed;Education provided;Falls evaluation completed Falls prevention discussed;Education provided;Falls evaluation completed Falls evaluation completed;Education provided;Falls prevention discussed   PHQ 2/9 Scores 11/28/2019 07/18/2019  PHQ - 2 Score 0 0    Assessment:   Care Plan There are no care plans that you recently modified to display for this patient.  Goals Addressed             This Visit's Progress    [facility]) DIET - INCREASE WATER INTAKE       Timeframe:  Long-Range Goal Priority:  Medium Start Date: 12/29/20                             Expected End Date: 01/10/22 Follow Up Date: 04/12/21  -Encouraged patient to drink 4 bottles of water daily  Notes: Patient reports that she does want to increase the amount of water she drinks daily to ensure she is stay well hydrated. Patient states she will begin to drink 4 bottles of  water a day.                           [facility]) Track and Manage My Blood Pressure-Hypertension       Timeframe:  Long-Range Goal Priority:  High Start Date: 03/04/20                            Expected End Date: 09/10/21                     Follow Up Date 04/12/21 -Discussed monitoring B/P at least 3 times weekly - write blood pressure results in a log or diary -Encouraged continuation of eating a low sodium, heart healthy diet -Encouraged continuation of doing Physical Therapy exercises, walk, and staying physically active maintaining home   Why is this important?   You won't feel high blood pressure, but it can still hurt your blood vessels.  High blood pressure can cause heart or kidney problems. It can also cause a stroke.  Making lifestyle changes like losing a little weight or eating less salt will help.  Checking your blood pressure at home and at different times of the day can help to control blood pressure.  If the doctor prescribes medicine remember to take it the way the doctor ordered.  Call the office if you cannot afford the medicine or if there are questions about it.     Notes: 03/04/20: Patient states she is taking her B/P 2-3 times weekly and has not been recording the values due to being away from home currently. Patient explained she will be back home the first of the year, plans to start to take her B/P daily, and she will record her values. Nurse will send patient a calendar booklet for her to record her B/P values.   Updated 05/25/20: Patient continues to monitor her B/P 2-3 times weekly. She reports that she did not receive the calendar booklet. Nurse will send calendar booklet today. Patient states she does want to increase her physical activity and nurse will send chair exercise printouts. She is adhering to a low sodium diet.  Updated 07/28/20: Patient states that she did receive the calendar booklet and chair exercise printouts. Patient is doing chair exercises, PT  exercises for her knee, adhering to a low sodium diet, and she monitors her B/P and records the values routinely. Patient explains her goal is to recover from her upcoming knee replacement surgery and then work towards lowering her B/P.  Updated 12/29/20: Patient reports taking her B/P a few times a week. Patient states her ranges are around 993 systolic and 80 or below diastolic. She explains that she will begin to take her B/P  1-2 times daily and record the values into the calendar booklet to bring to her upcoming PCP appointment. Patient states she does want to work towards lowering her B/P and she will discuss medication options with her provider. Patient continues to eat healthy, walk and do PT exercises routinely, and limit the sodium in her diet.          Plan: RN Health Coach will call send PCP today's assessment note and will call patient within the month of January. Follow-up: Patient agrees to Care Plan and Follow-up.  [name] RN, BSN [place] [phone] [email or web address]

## 2020-12-30 DIAGNOSIS — H04123 Dry eye syndrome of bilateral lacrimal glands: Secondary | ICD-10-CM | POA: Diagnosis not present

## 2020-12-30 DIAGNOSIS — E119 Type 2 diabetes mellitus without complications: Secondary | ICD-10-CM | POA: Diagnosis not present

## 2020-12-30 DIAGNOSIS — H2513 Age-related nuclear cataract, bilateral: Secondary | ICD-10-CM | POA: Diagnosis not present

## 2020-12-30 DIAGNOSIS — H5203 Hypermetropia, bilateral: Secondary | ICD-10-CM | POA: Diagnosis not present

## 2020-12-30 DIAGNOSIS — H40023 Open angle with borderline findings, high risk, bilateral: Secondary | ICD-10-CM | POA: Diagnosis not present

## 2020-12-30 DIAGNOSIS — H524 Presbyopia: Secondary | ICD-10-CM | POA: Diagnosis not present

## 2020-12-30 DIAGNOSIS — H52222 Regular astigmatism, left eye: Secondary | ICD-10-CM | POA: Diagnosis not present

## 2021-01-04 ENCOUNTER — Other Ambulatory Visit: Payer: Self-pay

## 2021-01-04 ENCOUNTER — Ambulatory Visit
Admission: RE | Admit: 2021-01-04 | Discharge: 2021-01-04 | Disposition: A | Discharge status: Home / Self Care | Payer: Medicare HMO | Source: Ambulatory Visit | Attending: Internal Medicine | Admitting: Internal Medicine

## 2021-01-04 DIAGNOSIS — E785 Hyperlipidemia, unspecified: Secondary | ICD-10-CM | POA: Diagnosis not present

## 2021-01-04 DIAGNOSIS — N183 Chronic kidney disease, stage 3 unspecified: Secondary | ICD-10-CM | POA: Diagnosis not present

## 2021-01-04 DIAGNOSIS — Z23 Encounter for immunization: Secondary | ICD-10-CM | POA: Diagnosis not present

## 2021-01-04 DIAGNOSIS — R300 Dysuria: Secondary | ICD-10-CM | POA: Diagnosis not present

## 2021-01-04 DIAGNOSIS — Z1389 Encounter for screening for other disorder: Secondary | ICD-10-CM | POA: Diagnosis not present

## 2021-01-04 DIAGNOSIS — M109 Gout, unspecified: Secondary | ICD-10-CM | POA: Diagnosis not present

## 2021-01-04 DIAGNOSIS — I10 Essential (primary) hypertension: Secondary | ICD-10-CM | POA: Diagnosis not present

## 2021-01-04 DIAGNOSIS — Z1331 Encounter for screening for depression: Secondary | ICD-10-CM | POA: Diagnosis not present

## 2021-01-04 DIAGNOSIS — E1169 Type 2 diabetes mellitus with other specified complication: Secondary | ICD-10-CM | POA: Diagnosis not present

## 2021-01-04 DIAGNOSIS — E559 Vitamin D deficiency, unspecified: Secondary | ICD-10-CM | POA: Diagnosis not present

## 2021-01-04 DIAGNOSIS — E669 Obesity, unspecified: Secondary | ICD-10-CM | POA: Diagnosis not present

## 2021-01-04 DIAGNOSIS — Z0000 Encounter for general adult medical examination without abnormal findings: Secondary | ICD-10-CM | POA: Diagnosis not present

## 2021-01-04 DIAGNOSIS — R202 Paresthesia of skin: Secondary | ICD-10-CM | POA: Diagnosis not present

## 2021-01-04 DIAGNOSIS — Z1231 Encounter for screening mammogram for malignant neoplasm of breast: Secondary | ICD-10-CM

## 2021-01-04 DIAGNOSIS — I83893 Varicose veins of bilateral lower extremities with other complications: Secondary | ICD-10-CM | POA: Diagnosis not present

## 2021-01-04 DIAGNOSIS — J309 Allergic rhinitis, unspecified: Secondary | ICD-10-CM | POA: Diagnosis not present

## 2021-01-07 DIAGNOSIS — N183 Chronic kidney disease, stage 3 unspecified: Secondary | ICD-10-CM | POA: Diagnosis not present

## 2021-01-07 DIAGNOSIS — G8929 Other chronic pain: Secondary | ICD-10-CM | POA: Diagnosis not present

## 2021-01-07 DIAGNOSIS — I10 Essential (primary) hypertension: Secondary | ICD-10-CM | POA: Diagnosis not present

## 2021-01-07 DIAGNOSIS — M1990 Unspecified osteoarthritis, unspecified site: Secondary | ICD-10-CM | POA: Diagnosis not present

## 2021-01-07 DIAGNOSIS — E785 Hyperlipidemia, unspecified: Secondary | ICD-10-CM | POA: Diagnosis not present

## 2021-01-07 DIAGNOSIS — M1710 Unilateral primary osteoarthritis, unspecified knee: Secondary | ICD-10-CM | POA: Diagnosis not present

## 2021-01-07 DIAGNOSIS — E1169 Type 2 diabetes mellitus with other specified complication: Secondary | ICD-10-CM | POA: Diagnosis not present

## 2021-02-18 DIAGNOSIS — N183 Chronic kidney disease, stage 3 unspecified: Secondary | ICD-10-CM | POA: Diagnosis not present

## 2021-02-18 DIAGNOSIS — E785 Hyperlipidemia, unspecified: Secondary | ICD-10-CM | POA: Diagnosis not present

## 2021-02-18 DIAGNOSIS — M1990 Unspecified osteoarthritis, unspecified site: Secondary | ICD-10-CM | POA: Diagnosis not present

## 2021-02-18 DIAGNOSIS — M1710 Unilateral primary osteoarthritis, unspecified knee: Secondary | ICD-10-CM | POA: Diagnosis not present

## 2021-02-18 DIAGNOSIS — G8929 Other chronic pain: Secondary | ICD-10-CM | POA: Diagnosis not present

## 2021-02-18 DIAGNOSIS — I10 Essential (primary) hypertension: Secondary | ICD-10-CM | POA: Diagnosis not present

## 2021-02-18 DIAGNOSIS — E1169 Type 2 diabetes mellitus with other specified complication: Secondary | ICD-10-CM | POA: Diagnosis not present

## 2021-03-30 DIAGNOSIS — M4726 Other spondylosis with radiculopathy, lumbar region: Secondary | ICD-10-CM | POA: Diagnosis not present

## 2021-03-30 DIAGNOSIS — M160 Bilateral primary osteoarthritis of hip: Secondary | ICD-10-CM | POA: Diagnosis not present

## 2021-03-31 ENCOUNTER — Other Ambulatory Visit: Payer: Self-pay | Admitting: *Deleted

## 2021-03-31 DIAGNOSIS — H2511 Age-related nuclear cataract, right eye: Secondary | ICD-10-CM | POA: Diagnosis not present

## 2021-03-31 NOTE — Patient Instructions (Signed)
Visit Information  Thank you for taking time to visit with me today. Please don't hesitate to contact me if I can be of assistance to you before our next scheduled telephone appointment.  Following are the goals we discussed today:  Patient Goals/Self-Care Activities: Take all medications as prescribed Attend all scheduled provider appointments Call provider office for new concerns or questions  check blood pressure 3 times per week write blood pressure results in a log or diary take blood pressure log to all doctor appointments call doctor for signs and symptoms of high blood pressure keep all doctor appointments take medications for blood pressure exactly as prescribed eat more whole grains, fruits and vegetables, lean meats and healthy fats Continue to do orthopedic exercises, walk, and ride your stationary bike daily Continue to eat a heart healthy, low sodium diet    Patient verbalizes understanding of instructions and care plan provided today and agrees to view in [place]. Active MyChart status confirmed with patient.    Telephone follow up appointment with care management team member scheduled YBW:LSLHT  [name] RN, [place] [phone] [email or web address]

## 2021-03-31 NOTE — Patient Outreach (Signed)
[facility]) Care Management  03/31/2021  Terri Miranda 02/19/1946 [id number]  [facility] [facility]) Care Management RN Health Coach Note   03/31/2021 Name:  Terri Miranda MRN:  [id number] DOB:  1945-06-12  Summary: Patient states she is doing well. Patient reports her B/P has improved since her PCP changed her medication with ranges in the 086'V systolic. Patient continues to eat a heart healthy, low sodium diet and states she is drinking 4 bottles of water daily. Patient explains that she continues to walk, do orthopedic exercises, and rides her stationary bike routinely. Patient did not have any further questions or concerns today and did confirm that she has this nurse's contact number to call her if needed.    Recommendations/Changes made from today's visit: Continue to do orthopedic exercises, walk, and ride your stationary bike daily Continue to eat a heart healthy, low sodium diet Continue to monitor your B/P 3 times a week  Subjective: Terri Miranda is an 76 y.o. year old female who is a primary patient of Leeroy Cha, MD. The care management team was consulted for assistance with care management and/or care coordination needs.    RN Health Coach completed Telephone Visit today.   Objective:  Medications Reviewed Today     Reviewed by Michiel Cowboy, RN (Registered Nurse) on 03/31/21 at 35  Med List Status: <None>   Medication Order Taking? Sig Documenting Provider Last Dose Status Informant  acetaminophen (TYLENOL) 500 MG tablet [id number] Yes Take 500 mg by mouth every 6 (six) hours as needed for moderate pain or headache. [provider] Taking Active Self  allopurinol (ZYLOPRIM) 100 MG tablet [id number] Yes Take 100 mg by mouth daily. [provider] Taking Active Self  aspirin EC 81 MG tablet [id number] Yes Take 1 tablet (81 mg total) by mouth daily. Rogene Houston, MD Taking Active Self   Carboxymethylcellul-Glycerin (LUBRICATING EYE DROPS OP) [id number] Yes Place 1 drop into both eyes daily as needed (dry eyes).  [provider] Taking Active Self  cholecalciferol (VITAMIN D3) 25 MCG (1000 UT) tablet [id number] Yes Take 1,000 Units by mouth daily. [provider] Taking Active Self  diclofenac sodium (VOLTAREN) 1 % GEL [id number] Yes Apply 2 g topically 2 (two) times daily as needed (knee pain).  [provider] Taking Active Self  enalapril (VASOTEC) 10 MG tablet [id number] Yes Take 10 mg by mouth daily.  [provider] Taking Active Self  fish oil-omega-3 fatty acids 1000 MG capsule [id number] Yes Take 1 g by mouth daily. [provider] Taking Active Self  hydrochlorothiazide (HYDRODIURIL) 25 MG tablet [id number] Yes Take 25 mg by mouth daily. [provider] Taking Active Self  linaclotide Rolan Lipa) 72 MCG capsule [id number] Yes Take 1 capsule (72 mcg total) by mouth daily before breakfast. Take 30 min before breakfast. Ezzard Standing, PA-C Taking Active Self  loratadine (CLARITIN) 10 MG tablet [id number] Yes Take 10 mg by mouth daily as needed for allergies. [provider] Taking Active Self  metFORMIN (GLUCOPHAGE-XR) 500 MG 24 hr tablet [id number] Yes Take 500 mg by mouth at bedtime.  [provider] Taking Active Self  Multiple Vitamin (MULTIVITAMIN WITH MINERALS) TABS [id number] Yes Take 1 tablet by mouth every other day.  [provider] Taking Active Self  oxymetazoline (AFRIN) 0.05 % nasal spray [id number] No Place 2 sprays into the nose daily as needed for congestion.   Patient not taking: Reported on 12/29/2020   [provider] Not Taking Active Self           Med Note Laretta Alstrom, Dechelle Attaway A   Tue Dec 29, 2020  1:17 PM) Reports not taking  pravastatin (PRAVACHOL) 20 MG tablet [id number] Yes Take 20 mg by mouth at bedtime.  [provider] Taking Active Self             SDOH:   (Social Determinants of Health) assessments and interventions performed: SDOH assessments completed today and documented in the Epic system.      Care Plan  Review of patient past medical history, allergies, medications, health status, including review of consultants reports, laboratory and other test data, was performed as part of comprehensive evaluation for care management services.   Care Plan : Hypertension (Adult)  Updates made by Michiel Cowboy, RN since 03/31/2021 12:00 AM     Problem: Hypertension (Hypertension) Resolved 03/31/2021  Priority: High     Long-Range Goal: Hypertension Monitored Completed 03/31/2021  Start Date: 03/04/2020  Expected End Date: 03/12/2021  Note:   Resolving due to duplicate goal  Evidence-based guidance:  Promote initial use of ambulatory blood pressure measurements (for 3 days) to rule out "white-coat" effect; identify masked hypertension and presence or absence of nocturnal "dipping" of blood pressure.   Encourage continued use of home blood pressure monitoring and recording in blood pressure log; include symptoms of hypotension or potential medication side effects in log.  Review blood pressure measurements taken inside and outside of the provider office; establish baseline and monitor trends; compare to target ranges or patient goal.  Share overall cardiovascular risk with patient; encourage changes to lifestyle risk factors, including alcohol consumption, smoking, inadequate exercise, poor dietary habits and stress.   Notes:     Task: Identify and Monitor Blood Pressure Elevation Completed 03/31/2021  Due Date: 03/12/2021  Note:   Care Management Activities:    - blood pressure trends reviewed - home or ambulatory blood pressure monitoring encouraged    Notes:     Problem: Disease Progression (Hypertension) Resolved 03/31/2021  Priority: High     Long-Range Goal: Disease Progression Prevented or Minimized Completed 03/31/2021  Start  Date: 03/04/2020  Expected End Date: 03/12/2021  Note:   Resolving due to duplicate goal  Evidence-based guidance:  Tailor lifestyle advice to individual; review progress regularly; give frequent encouragement and respond positively to incremental successes.  Assess for and promote awareness of worsening disease or development of comorbidity.  Prepare patient for laboratory and diagnostic exams based on risk and presentation.  Prepare patient for use of pharmacologic therapy that may include diuretic, beta-blocker, beta-blocker/thiazide combination, angiotensin-converting enzyme inhibitor, renin-angiotensin blocker or calcium-channel blocker.  Expect periodic adjustments to pharmacologic therapy; manage side effects.  Promote a healthy diet that includes primarily plant-based foods, such as fruits, vegetables, whole grains, beans and legumes, low-fat dairy and lean meats.   Consider moderate reduction in sodium intake by avoiding the addition of salt to prepared foods and limiting processed meats, canned soup, frozen meals and salty snacks.   Promote a regular, daily exercise goal of 150 minutes per week of moderate exercise based on tolerance, ability and patient choice; consider referral to physical therapist, community wellness and/or activity program.  Encourage the avoidance of no more than 2 hours per day of sedentary activity, such as recreational screen time.  Review sources of stress; explore current coping strategies and encourage use of mindfulness, yoga, meditation or exercise to manage stress.   Notes:     Task:  Alleviate Barriers to Hypertension Treatment Completed 03/31/2021  Due Date: 03/12/2021  Note:   Care Management Activities:    - healthy diet promoted - medical nutrition therapy provided - medication side effects managed - pain assessed and managed - reduction of dietary sodium encouraged - reduction in sedentary activities encouraged    Notes:     Care Plan :  RN Tour manager Plan of Care  Updates made by Michiel Cowboy, RN since 03/31/2021 12:00 AM     Problem: Knowledge Deficit Related to Hypertension   Priority: High     Long-Range Goal: Development of Plan of Care for the Management of Hypertension   Start Date: 03/31/2021  Expected End Date: 04/12/2021  Priority: High  Note:   Current Barriers:  Chronic Disease Management support and education needs related to HTN   RNCM Clinical Goal(s):  Patient will demonstrate Improved adherence to prescribed treatment plan for HTN as evidenced by maintaining her B/P <150/90 continue to work with RN Care Manager to address care management and care coordination needs related to  HTN as evidenced by adherence to CM Team Scheduled appointments through collaboration with RN Care manager, provider, and care team.   Interventions: Inter-disciplinary care team collaboration (see longitudinal plan of care) Evaluation of current treatment plan related to  self management and patient's adherence to plan as established by provider   Hypertension Interventions:  (Status:  Goal on track:  Yes.) Long Term Goal Last practice recorded BP readings:  BP Readings from Last 3 Encounters:  05/08/20 136/74  12/25/19 128/61  11/21/19 (!) 148/70  Most recent eGFR/CrCl: No results found for: EGFR  No components found for: CRCL  Evaluation of current treatment plan related to hypertension self management and patient's adherence to plan as established by provider Reviewed medications with patient and discussed importance of compliance Discussed complications of poorly controlled blood pressure such as heart disease, stroke, circulatory complications, vision complications, kidney impairment, sexual dysfunction Encouraged patient to continue to take her B/P 3 times a week and record the values Previously provided written hypertension, low sodium diet, heart healthy diet, and importance of exercise education to  patient Encouraged continuation of doing PT exercises, walking, and riding stationary bike daily Encouraged continuation of eating a heart healthy, low sodium diet   Patient Goals/Self-Care Activities: Take all medications as prescribed Attend all scheduled provider appointments Call provider office for new concerns or questions  check blood pressure 3 times per week write blood pressure results in a log or diary take blood pressure log to all doctor appointments call doctor for signs and symptoms of high blood pressure keep all doctor appointments take medications for blood pressure exactly as prescribed eat more whole grains, fruits and vegetables, lean meats and healthy fats Continue to do orthopedic exercises, walk, and ride your stationary bike daily Continue to eat a heart healthy, low sodium diet  Follow Up Plan:  Telephone follow up appointment with care management team member scheduled for:  April       Plan: Telephone follow up appointment with care management team member scheduled for:  April.  Nurse will send PCP a quarterly update.   [name] RN, Painter [phone] [email or web address]

## 2021-04-23 DIAGNOSIS — R300 Dysuria: Secondary | ICD-10-CM | POA: Diagnosis not present

## 2021-04-23 DIAGNOSIS — E1169 Type 2 diabetes mellitus with other specified complication: Secondary | ICD-10-CM | POA: Diagnosis not present

## 2021-04-23 DIAGNOSIS — M1710 Unilateral primary osteoarthritis, unspecified knee: Secondary | ICD-10-CM | POA: Diagnosis not present

## 2021-04-23 DIAGNOSIS — I10 Essential (primary) hypertension: Secondary | ICD-10-CM | POA: Diagnosis not present

## 2021-04-23 DIAGNOSIS — Z7984 Long term (current) use of oral hypoglycemic drugs: Secondary | ICD-10-CM | POA: Diagnosis not present

## 2021-04-23 DIAGNOSIS — M1A9XX0 Chronic gout, unspecified, without tophus (tophi): Secondary | ICD-10-CM | POA: Diagnosis not present

## 2021-05-10 DIAGNOSIS — N3000 Acute cystitis without hematuria: Secondary | ICD-10-CM | POA: Diagnosis not present

## 2021-05-10 DIAGNOSIS — I10 Essential (primary) hypertension: Secondary | ICD-10-CM | POA: Diagnosis not present

## 2021-05-10 DIAGNOSIS — R300 Dysuria: Secondary | ICD-10-CM | POA: Diagnosis not present

## 2021-05-20 DIAGNOSIS — H2511 Age-related nuclear cataract, right eye: Secondary | ICD-10-CM | POA: Diagnosis not present

## 2021-06-11 ENCOUNTER — Other Ambulatory Visit: Payer: Self-pay | Admitting: *Deleted

## 2021-06-11 NOTE — Patient Outreach (Signed)
[place] [facility] [facility]) Care Management ? ?06/11/2021 ? ?[place] ?[birth year] ?[id number] ? ?Nurse left patient a VM explaining that Dr. Fara Olden has a Nurse Care Manager in the office that will follow up with the patient's care.  ? ?Plan: ?RN Health Coach will close case and will send patient a case closed letter.  ? ?[name] RN, BSN ?[facility] Care Management  ?RN Health Coach ?[phone] ?[email or web address] ? ? ? ?

## 2021-06-28 DIAGNOSIS — M899 Disorder of bone, unspecified: Secondary | ICD-10-CM | POA: Diagnosis not present

## 2021-06-28 DIAGNOSIS — M2550 Pain in unspecified joint: Secondary | ICD-10-CM | POA: Diagnosis not present

## 2021-06-28 DIAGNOSIS — R21 Rash and other nonspecific skin eruption: Secondary | ICD-10-CM | POA: Diagnosis not present

## 2021-06-28 DIAGNOSIS — L819 Disorder of pigmentation, unspecified: Secondary | ICD-10-CM | POA: Diagnosis not present

## 2021-07-01 DIAGNOSIS — M1A9XX0 Chronic gout, unspecified, without tophus (tophi): Secondary | ICD-10-CM | POA: Diagnosis not present

## 2021-07-01 DIAGNOSIS — I10 Essential (primary) hypertension: Secondary | ICD-10-CM | POA: Diagnosis not present

## 2021-07-01 DIAGNOSIS — E1169 Type 2 diabetes mellitus with other specified complication: Secondary | ICD-10-CM | POA: Diagnosis not present

## 2021-07-01 DIAGNOSIS — Z0100 Encounter for examination of eyes and vision without abnormal findings: Secondary | ICD-10-CM | POA: Diagnosis not present

## 2021-07-05 ENCOUNTER — Ambulatory Visit: Payer: Medicare HMO | Admitting: *Deleted

## 2021-07-05 DIAGNOSIS — M1A9XX0 Chronic gout, unspecified, without tophus (tophi): Secondary | ICD-10-CM | POA: Diagnosis not present

## 2021-07-05 DIAGNOSIS — E1169 Type 2 diabetes mellitus with other specified complication: Secondary | ICD-10-CM | POA: Diagnosis not present

## 2021-08-16 DIAGNOSIS — M109 Gout, unspecified: Secondary | ICD-10-CM | POA: Diagnosis not present

## 2021-08-16 DIAGNOSIS — I129 Hypertensive chronic kidney disease with stage 1 through stage 4 chronic kidney disease, or unspecified chronic kidney disease: Secondary | ICD-10-CM | POA: Diagnosis not present

## 2021-08-16 DIAGNOSIS — I10 Essential (primary) hypertension: Secondary | ICD-10-CM | POA: Diagnosis not present

## 2021-08-16 DIAGNOSIS — N1831 Chronic kidney disease, stage 3a: Secondary | ICD-10-CM | POA: Diagnosis not present

## 2021-10-18 DIAGNOSIS — N1831 Chronic kidney disease, stage 3a: Secondary | ICD-10-CM | POA: Diagnosis not present

## 2021-10-18 DIAGNOSIS — I129 Hypertensive chronic kidney disease with stage 1 through stage 4 chronic kidney disease, or unspecified chronic kidney disease: Secondary | ICD-10-CM | POA: Diagnosis not present

## 2021-12-01 ENCOUNTER — Other Ambulatory Visit: Payer: Self-pay | Admitting: Internal Medicine

## 2021-12-01 DIAGNOSIS — Z1231 Encounter for screening mammogram for malignant neoplasm of breast: Secondary | ICD-10-CM

## 2021-12-06 DIAGNOSIS — M1612 Unilateral primary osteoarthritis, left hip: Secondary | ICD-10-CM | POA: Diagnosis not present

## 2021-12-06 DIAGNOSIS — M545 Low back pain, unspecified: Secondary | ICD-10-CM | POA: Diagnosis not present

## 2021-12-06 DIAGNOSIS — M170 Bilateral primary osteoarthritis of knee: Secondary | ICD-10-CM | POA: Diagnosis not present

## 2021-12-14 DIAGNOSIS — H43812 Vitreous degeneration, left eye: Secondary | ICD-10-CM | POA: Diagnosis not present

## 2021-12-14 DIAGNOSIS — H2512 Age-related nuclear cataract, left eye: Secondary | ICD-10-CM | POA: Diagnosis not present

## 2021-12-14 DIAGNOSIS — Z961 Presence of intraocular lens: Secondary | ICD-10-CM | POA: Diagnosis not present

## 2021-12-21 DIAGNOSIS — M1612 Unilateral primary osteoarthritis, left hip: Secondary | ICD-10-CM | POA: Diagnosis not present

## 2022-01-05 ENCOUNTER — Ambulatory Visit
Admission: RE | Admit: 2022-01-05 | Discharge: 2022-01-05 | Disposition: A | Discharge status: Home / Self Care | Payer: Medicare HMO | Source: Ambulatory Visit | Attending: Internal Medicine | Admitting: Internal Medicine

## 2022-01-05 DIAGNOSIS — Z1231 Encounter for screening mammogram for malignant neoplasm of breast: Secondary | ICD-10-CM

## 2022-01-10 DIAGNOSIS — E1169 Type 2 diabetes mellitus with other specified complication: Secondary | ICD-10-CM | POA: Diagnosis not present

## 2022-01-10 DIAGNOSIS — I10 Essential (primary) hypertension: Secondary | ICD-10-CM | POA: Diagnosis not present

## 2022-01-10 DIAGNOSIS — Z0000 Encounter for general adult medical examination without abnormal findings: Secondary | ICD-10-CM | POA: Diagnosis not present

## 2022-01-10 DIAGNOSIS — Z1331 Encounter for screening for depression: Secondary | ICD-10-CM | POA: Diagnosis not present

## 2022-01-10 DIAGNOSIS — Z23 Encounter for immunization: Secondary | ICD-10-CM | POA: Diagnosis not present

## 2022-01-10 DIAGNOSIS — B351 Tinea unguium: Secondary | ICD-10-CM | POA: Diagnosis not present

## 2022-01-10 DIAGNOSIS — N1831 Chronic kidney disease, stage 3a: Secondary | ICD-10-CM | POA: Diagnosis not present

## 2022-01-10 DIAGNOSIS — I129 Hypertensive chronic kidney disease with stage 1 through stage 4 chronic kidney disease, or unspecified chronic kidney disease: Secondary | ICD-10-CM | POA: Diagnosis not present

## 2022-01-10 DIAGNOSIS — R300 Dysuria: Secondary | ICD-10-CM | POA: Diagnosis not present

## 2022-01-10 DIAGNOSIS — M1A9XX0 Chronic gout, unspecified, without tophus (tophi): Secondary | ICD-10-CM | POA: Diagnosis not present

## 2022-01-10 DIAGNOSIS — E785 Hyperlipidemia, unspecified: Secondary | ICD-10-CM | POA: Diagnosis not present

## 2022-01-10 DIAGNOSIS — I83893 Varicose veins of bilateral lower extremities with other complications: Secondary | ICD-10-CM | POA: Diagnosis not present

## 2022-04-07 DIAGNOSIS — E119 Type 2 diabetes mellitus without complications: Secondary | ICD-10-CM | POA: Diagnosis not present

## 2022-04-20 DIAGNOSIS — E119 Type 2 diabetes mellitus without complications: Secondary | ICD-10-CM | POA: Diagnosis not present

## 2022-04-22 DIAGNOSIS — E1122 Type 2 diabetes mellitus with diabetic chronic kidney disease: Secondary | ICD-10-CM | POA: Diagnosis not present

## 2022-04-22 DIAGNOSIS — I10 Essential (primary) hypertension: Secondary | ICD-10-CM | POA: Diagnosis not present

## 2022-04-22 DIAGNOSIS — N1831 Chronic kidney disease, stage 3a: Secondary | ICD-10-CM | POA: Diagnosis not present

## 2022-04-22 DIAGNOSIS — R609 Edema, unspecified: Secondary | ICD-10-CM | POA: Diagnosis not present

## 2022-04-26 ENCOUNTER — Ambulatory Visit: Payer: Medicare HMO | Admitting: Podiatry

## 2022-04-26 DIAGNOSIS — B351 Tinea unguium: Secondary | ICD-10-CM | POA: Diagnosis not present

## 2022-04-26 DIAGNOSIS — M79674 Pain in right toe(s): Secondary | ICD-10-CM

## 2022-04-26 DIAGNOSIS — M79675 Pain in left toe(s): Secondary | ICD-10-CM

## 2022-04-26 NOTE — Progress Notes (Signed)
  Subjective:  Patient ID: Terri Miranda, female    DOB: 07/09/1945,  MRN: [id number]  Chief Complaint  Patient presents with   Nail Problem   77 y.o. female returns for the above complaint.  Patient presents with thickened elongated dystrophic toenails times; palpation hurts worse with ambulation or shoe pressure she would like me to be done she is not able to resolve.  Objective:  There were no vitals filed for this visit. Podiatric Exam: Vascular: dorsalis pedis and posterior tibial pulses are palpable bilateral. Capillary return is immediate. Temperature gradient is WNL. Skin turgor WNL  Sensorium: Normal Semmes Weinstein monofilament test. Normal tactile sensation bilaterally. Nail Exam: Pt has thick disfigured discolored nails with subungual debris noted bilateral entire nail hallux through fifth toenails.  Pain on palpation to the nails. Ulcer Exam: There is no evidence of ulcer or pre-ulcerative changes or infection. Orthopedic Exam: Muscle tone and strength are WNL. No limitations in general ROM. No crepitus or effusions noted.  Skin: No Porokeratosis. No infection or ulcers    Assessment & Plan:   1. Pain due to onychomycosis of toenails of both feet     Patient was evaluated and treated and all questions answered.  Onychomycosis with pain  -Nails palliatively debrided as below. -Educated on self-care  Procedure: Nail Debridement Rationale: pain  Type of Debridement: manual, sharp debridement. Instrumentation: Nail nipper, rotary burr. Number of Nails: 10  Procedures and Treatment: Consent by patient was obtained for treatment procedures. The patient understood the discussion of treatment and procedures well. All questions were answered thoroughly reviewed. Debridement of mycotic and hypertrophic toenails, 1 through 5 bilateral and clearing of subungual debris. No ulceration, no infection noted.  Return Visit-Office Procedure: Patient instructed to return to the  office for a follow up visit 3 months for continued evaluation and treatment.  Boneta Lucks, DPM    No follow-ups on file.

## 2022-04-27 ENCOUNTER — Ambulatory Visit: Payer: Medicare HMO | Attending: Cardiology | Admitting: Cardiology

## 2022-04-27 ENCOUNTER — Encounter: Payer: Self-pay | Admitting: Cardiology

## 2022-04-27 VITALS — BP 130/78 | HR 55 | Ht 67.5 in | Wt 221.4 lb

## 2022-04-27 DIAGNOSIS — R600 Localized edema: Secondary | ICD-10-CM | POA: Diagnosis not present

## 2022-04-27 DIAGNOSIS — I10 Essential (primary) hypertension: Secondary | ICD-10-CM

## 2022-04-27 NOTE — Patient Instructions (Signed)
Medication Instructions:  Your physician recommends that you continue on your current medications as directed. Please refer to the Current Medication list given to you today.   Labwork: None  Testing/Procedures: None  Follow-Up: Follow up with Dr. Branch in 1 year.   Any Other Special Instructions Will Be Listed Below (If Applicable).     If you need a refill on your cardiac medications before your next appointment, please call your pharmacy.  

## 2022-04-27 NOTE — Progress Notes (Signed)
Clinical Summary Ms. Nicklaus is a 77 y.o.female seen today for follow up of the following medical problems.      1. LE edema  increasing swelling she reports over the last few months 05/2020 echo: LVEF 65-70%, no WMAs, grade I dd.    - 3 weeks ago increased swelling - pcp gave few of lasix and improved and now back to her baseline - no significant SOB/DOE, no orthopnea/PND   Prior cardiac testing done 2022 after preop evaluation 05/2020 nuclear stress test: no defects, no ischemia 05/2020 echo: LVEF 65-70%, no WMAs,  Past Medical History:  Diagnosis Date   Diabetes mellitus without complication ([place])    Essential hypertension    Mitral valve prolapse    Mixed hyperlipidemia    Palpitations    Pre-diabetes      Allergies  Allergen Reactions   Shellfish Allergy Anaphylaxis and Hives   Lovastatin     myalgias     Current Outpatient Medications  Medication Sig Dispense Refill   acetaminophen (TYLENOL) 500 MG tablet Take 500 mg by mouth every 6 (six) hours as needed for moderate pain or headache.     allopurinol (ZYLOPRIM) 100 MG tablet Take 100 mg by mouth daily.     aspirin EC 81 MG tablet Take 1 tablet (81 mg total) by mouth daily.     Carboxymethylcellul-Glycerin (LUBRICATING EYE DROPS OP) Place 1 drop into both eyes daily as needed (dry eyes).      cholecalciferol (VITAMIN D3) 25 MCG (1000 UT) tablet Take 1,000 Units by mouth daily.     diclofenac sodium (VOLTAREN) 1 % GEL Apply 2 g topically 2 (two) times daily as needed (knee pain).      enalapril (VASOTEC) 10 MG tablet Take 10 mg by mouth daily.      fish oil-omega-3 fatty acids 1000 MG capsule Take 1 g by mouth daily.     hydrochlorothiazide (HYDRODIURIL) 25 MG tablet Take 25 mg by mouth daily.     linaclotide (LINZESS) 72 MCG capsule Take 1 capsule (72 mcg total) by mouth daily before breakfast. Take 30 min before breakfast. 30 capsule 3   loratadine (CLARITIN) 10 MG tablet Take 10 mg by mouth daily as  needed for allergies.     metFORMIN (GLUCOPHAGE-XR) 500 MG 24 hr tablet Take 500 mg by mouth at bedtime.      Multiple Vitamin (MULTIVITAMIN WITH MINERALS) TABS Take 1 tablet by mouth every other day.      oxymetazoline (AFRIN) 0.05 % nasal spray Place 2 sprays into the nose daily as needed for congestion.  (Patient not taking: Reported on 12/29/2020)     pravastatin (PRAVACHOL) 20 MG tablet Take 20 mg by mouth at bedtime.      No current facility-administered medications for this visit.     Past Surgical History:  Procedure Laterality Date   BREAST EXCISIONAL BIOPSY Right    COLONOSCOPY N/A 11/14/2018   Procedure: COLONOSCOPY;  Surgeon: Rogene Houston, MD;  [facility];  Service: Endoscopy;  Laterality: N/A;  1030   COLONOSCOPY WITH PROPOFOL N/A 12/25/2019   Procedure: COLONOSCOPY WITH PROPOFOL;  Surgeon: Harvel Quale, MD;  [facility];  Service: Gastroenterology;  Laterality: N/A;  1015   POLYPECTOMY  11/14/2018   Procedure: POLYPECTOMY;  Surgeon: Rogene Houston, MD;  [facility];  Service: Endoscopy;;  colon   POLYPECTOMY  12/25/2019   Procedure: POLYPECTOMY;  Surgeon: Harvel Quale, MD;  [facility];  Service: Gastroenterology;;     Allergies  Allergen Reactions   Shellfish Allergy Anaphylaxis and Hives   Lovastatin     myalgias      Family History  Problem Relation Age of Onset   Hypertension Mother    CVA Mother    Coronary artery disease Father    Prostate cancer Father    Hypertension Sister    Coronary artery disease Sister    Breast cancer Maternal Aunt      Social History Ms. Retz reports that she has never smoked. She has never used smokeless tobacco. Ms. Tudor reports current alcohol use.   Review of Systems CONSTITUTIONAL: No weight loss, fever, chills, weakness or fatigue.  HEENT: Eyes: No visual loss, blurred vision, double vision or yellow sclerae.No hearing loss,  sneezing, congestion, runny nose or sore throat.  SKIN: No rash or itching.  CARDIOVASCULAR: per hpi RESPIRATORY: per hpi GASTROINTESTINAL: No anorexia, nausea, vomiting or diarrhea. No abdominal pain or blood.  GENITOURINARY: No burning on urination, no polyuria NEUROLOGICAL: No headache, dizziness, syncope, paralysis, ataxia, numbness or tingling in the extremities. No change in bowel or bladder control.  MUSCULOSKELETAL: No muscle, back pain, joint pain or stiffness.  LYMPHATICS: No enlarged nodes. No history of splenectomy.  PSYCHIATRIC: No history of depression or anxiety.  ENDOCRINOLOGIC: No reports of sweating, cold or heat intolerance. No polyuria or polydipsia.  Marland Kitchen   Physical Examination Today's Vitals   04/27/22 1132  BP: 130/78  Pulse: (!) 55  SpO2: 97%  Weight: 221 lb 6.4 oz (100.4 kg)  Height: 5' 7.5" (1.715 m)   Body mass index is 34.16 kg/m.  Gen: resting comfortably, no acute distress HEENT: no scleral icterus, pupils equal round and reactive, no palptable cervical adenopathy,  CV: RRR, no m/rg, no jvd Resp: Clear to auscultation bilaterally GI: abdomen is soft, non-tender, non-distended, normal bowel sounds, no hepatosplenomegaly MSK: extremities are warm, no edema.  Skin: warm, no rash Neuro:  no focal deficits Psych: appropriate affect   Diagnostic Studies  04/2015 echo Study Conclusions   - Left ventricle: The cavity size was normal. There was focal basal    hypertrophy. Systolic function was normal. The estimated ejection    fraction was in the range of 55% to 60%. Doppler parameters are    consistent with abnormal left ventricular relaxation (grade 1    diastolic dysfunction).  - Aortic valve: Mildly calcified annulus. Trileaflet; normal    thickness leaflets. Valve area (VTI): 3.01 cm^2. Valve area    (Vmax): 2.92 cm^2.  - Atrial septum: No defect or patent foramen ovale was identified.  - Technically adequate study.       05/2020 nuclear  stress No diagnostic ST segment changes to indicate ischemia. No significant myocardial perfusion defects to indicate scar or ischemia. There is breast attenuation artifact noted. This is a low risk study. Nuclear stress EF: 82%.  05/2020 echo 1. Left ventricular ejection fraction, by estimation, is 65 to 70%. The  left ventricle has normal function. The left ventricle has no regional  wall motion abnormalities. Left ventricular diastolic parameters are  indeterminate.   2. Right ventricular systolic function is normal. The right ventricular  size is normal. Tricuspid regurgitation signal is inadequate for assessing  PA pressure.   3. The mitral valve is grossly normal. Trivial mitral valve  regurgitation.   4. The aortic valve is tricuspid. Aortic valve regurgitation is not  visualized.   5. The inferior vena  cava is normal in size with greater than 50%  respiratory variability, suggesting right atrial pressure of 3 mmHg.    Assessment and Plan    1. LE edema - chronic edema several years - 2022 echo was essentially benign, indeterminant diastolic fxn. The reported E/A ratio would suggest perhaps some mild diastolic dysfunction.  - recent increased edema has resolved, if significant recurrence could consider repeat echo and consider giving Rx for prn lasix  2. HTN - at goal, conitnue current meds    Arnoldo Lenis, M.D.

## 2022-06-22 DIAGNOSIS — E119 Type 2 diabetes mellitus without complications: Secondary | ICD-10-CM | POA: Diagnosis not present

## 2022-07-06 ENCOUNTER — Ambulatory Visit: Payer: Medicare HMO | Admitting: Podiatry

## 2022-07-06 ENCOUNTER — Encounter: Payer: Self-pay | Admitting: Podiatry

## 2022-07-06 DIAGNOSIS — L600 Ingrowing nail: Secondary | ICD-10-CM | POA: Diagnosis not present

## 2022-07-06 NOTE — Patient Instructions (Signed)

## 2022-07-07 NOTE — Progress Notes (Signed)
Subjective:   Patient ID: Terri Miranda, female   DOB: 77 y.o.   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  I did today infiltrated the right big toe 60 mg Xylocaine Marcaine mixture sterile prep done and using sterile instrumentation remove the border exposed matrix applied phenol 3 applications 30 seconds followed by alcohol lavage sterile dressing gave instructions on soaks wear dressings 24 hours take it off earlier if throbbing were to occur and encouraged her to call questions concerns which may arise

## 2022-07-11 DIAGNOSIS — R208 Other disturbances of skin sensation: Secondary | ICD-10-CM | POA: Diagnosis not present

## 2022-07-11 DIAGNOSIS — I129 Hypertensive chronic kidney disease with stage 1 through stage 4 chronic kidney disease, or unspecified chronic kidney disease: Secondary | ICD-10-CM | POA: Diagnosis not present

## 2022-07-11 DIAGNOSIS — M109 Gout, unspecified: Secondary | ICD-10-CM | POA: Diagnosis not present

## 2022-07-11 DIAGNOSIS — N183 Chronic kidney disease, stage 3 unspecified: Secondary | ICD-10-CM | POA: Diagnosis not present

## 2022-07-11 DIAGNOSIS — E1169 Type 2 diabetes mellitus with other specified complication: Secondary | ICD-10-CM | POA: Diagnosis not present

## 2022-07-11 DIAGNOSIS — E1122 Type 2 diabetes mellitus with diabetic chronic kidney disease: Secondary | ICD-10-CM | POA: Diagnosis not present

## 2022-07-11 DIAGNOSIS — N1831 Chronic kidney disease, stage 3a: Secondary | ICD-10-CM | POA: Diagnosis not present

## 2022-07-11 DIAGNOSIS — I10 Essential (primary) hypertension: Secondary | ICD-10-CM | POA: Diagnosis not present

## 2022-07-14 DIAGNOSIS — M1711 Unilateral primary osteoarthritis, right knee: Secondary | ICD-10-CM | POA: Diagnosis not present

## 2022-07-14 DIAGNOSIS — M1612 Unilateral primary osteoarthritis, left hip: Secondary | ICD-10-CM | POA: Diagnosis not present

## 2022-07-18 DIAGNOSIS — E119 Type 2 diabetes mellitus without complications: Secondary | ICD-10-CM | POA: Diagnosis not present

## 2022-07-25 DIAGNOSIS — M25361 Other instability, right knee: Secondary | ICD-10-CM | POA: Diagnosis not present

## 2022-07-25 DIAGNOSIS — M7631 Iliotibial band syndrome, right leg: Secondary | ICD-10-CM | POA: Diagnosis not present

## 2022-07-28 DIAGNOSIS — M7631 Iliotibial band syndrome, right leg: Secondary | ICD-10-CM | POA: Diagnosis not present

## 2022-07-28 DIAGNOSIS — M25361 Other instability, right knee: Secondary | ICD-10-CM | POA: Diagnosis not present

## 2022-08-10 DIAGNOSIS — I10 Essential (primary) hypertension: Secondary | ICD-10-CM | POA: Diagnosis not present

## 2022-08-10 DIAGNOSIS — R609 Edema, unspecified: Secondary | ICD-10-CM | POA: Diagnosis not present

## 2022-08-10 DIAGNOSIS — L989 Disorder of the skin and subcutaneous tissue, unspecified: Secondary | ICD-10-CM | POA: Diagnosis not present

## 2022-08-10 DIAGNOSIS — M25361 Other instability, right knee: Secondary | ICD-10-CM | POA: Diagnosis not present

## 2022-08-10 DIAGNOSIS — M7631 Iliotibial band syndrome, right leg: Secondary | ICD-10-CM | POA: Diagnosis not present

## 2022-08-10 DIAGNOSIS — E669 Obesity, unspecified: Secondary | ICD-10-CM | POA: Diagnosis not present

## 2022-08-10 DIAGNOSIS — E1122 Type 2 diabetes mellitus with diabetic chronic kidney disease: Secondary | ICD-10-CM | POA: Diagnosis not present

## 2022-08-11 DIAGNOSIS — M25361 Other instability, right knee: Secondary | ICD-10-CM | POA: Diagnosis not present

## 2022-08-16 DIAGNOSIS — M25361 Other instability, right knee: Secondary | ICD-10-CM | POA: Diagnosis not present

## 2022-08-16 DIAGNOSIS — M7631 Iliotibial band syndrome, right leg: Secondary | ICD-10-CM | POA: Diagnosis not present

## 2022-08-17 DIAGNOSIS — E119 Type 2 diabetes mellitus without complications: Secondary | ICD-10-CM | POA: Diagnosis not present

## 2022-08-18 DIAGNOSIS — M7631 Iliotibial band syndrome, right leg: Secondary | ICD-10-CM | POA: Diagnosis not present

## 2022-08-18 DIAGNOSIS — M25361 Other instability, right knee: Secondary | ICD-10-CM | POA: Diagnosis not present

## 2022-08-23 DIAGNOSIS — L0390 Cellulitis, unspecified: Secondary | ICD-10-CM | POA: Diagnosis not present

## 2022-08-29 DIAGNOSIS — M25361 Other instability, right knee: Secondary | ICD-10-CM | POA: Diagnosis not present

## 2022-08-29 DIAGNOSIS — M7631 Iliotibial band syndrome, right leg: Secondary | ICD-10-CM | POA: Diagnosis not present

## 2022-12-05 DIAGNOSIS — L821 Other seborrheic keratosis: Secondary | ICD-10-CM | POA: Diagnosis not present

## 2022-12-05 DIAGNOSIS — L819 Disorder of pigmentation, unspecified: Secondary | ICD-10-CM | POA: Diagnosis not present

## 2022-12-05 DIAGNOSIS — D225 Melanocytic nevi of trunk: Secondary | ICD-10-CM | POA: Diagnosis not present

## 2022-12-05 DIAGNOSIS — L608 Other nail disorders: Secondary | ICD-10-CM | POA: Diagnosis not present

## 2022-12-05 DIAGNOSIS — Z7189 Other specified counseling: Secondary | ICD-10-CM | POA: Diagnosis not present

## 2022-12-06 ENCOUNTER — Other Ambulatory Visit: Payer: Self-pay | Admitting: Internal Medicine

## 2022-12-06 DIAGNOSIS — Z1231 Encounter for screening mammogram for malignant neoplasm of breast: Secondary | ICD-10-CM

## 2022-12-07 ENCOUNTER — Encounter (INDEPENDENT_AMBULATORY_CARE_PROVIDER_SITE_OTHER): Payer: Self-pay | Admitting: *Deleted

## 2022-12-21 DIAGNOSIS — Z961 Presence of intraocular lens: Secondary | ICD-10-CM | POA: Diagnosis not present

## 2022-12-21 DIAGNOSIS — E119 Type 2 diabetes mellitus without complications: Secondary | ICD-10-CM | POA: Diagnosis not present

## 2022-12-21 DIAGNOSIS — H43812 Vitreous degeneration, left eye: Secondary | ICD-10-CM | POA: Diagnosis not present

## 2022-12-21 DIAGNOSIS — H2512 Age-related nuclear cataract, left eye: Secondary | ICD-10-CM | POA: Diagnosis not present

## 2023-01-10 ENCOUNTER — Ambulatory Visit
Admission: RE | Admit: 2023-01-10 | Discharge: 2023-01-10 | Disposition: A | Discharge status: Home / Self Care | Payer: Medicare HMO | Source: Ambulatory Visit | Attending: Internal Medicine | Admitting: Internal Medicine

## 2023-01-10 DIAGNOSIS — Z1231 Encounter for screening mammogram for malignant neoplasm of breast: Secondary | ICD-10-CM

## 2023-01-13 DIAGNOSIS — Z1331 Encounter for screening for depression: Secondary | ICD-10-CM | POA: Diagnosis not present

## 2023-01-13 DIAGNOSIS — Z0000 Encounter for general adult medical examination without abnormal findings: Secondary | ICD-10-CM | POA: Diagnosis not present

## 2023-01-13 DIAGNOSIS — Z23 Encounter for immunization: Secondary | ICD-10-CM | POA: Diagnosis not present

## 2023-01-19 ENCOUNTER — Encounter: Payer: Self-pay | Admitting: Podiatry

## 2023-01-19 ENCOUNTER — Ambulatory Visit: Payer: Medicare HMO | Admitting: Podiatry

## 2023-01-19 VITALS — Ht 67.5 in | Wt 221.4 lb

## 2023-01-19 DIAGNOSIS — M79674 Pain in right toe(s): Secondary | ICD-10-CM | POA: Diagnosis not present

## 2023-01-19 DIAGNOSIS — B351 Tinea unguium: Secondary | ICD-10-CM | POA: Diagnosis not present

## 2023-01-19 DIAGNOSIS — M25361 Other instability, right knee: Secondary | ICD-10-CM | POA: Diagnosis not present

## 2023-01-19 DIAGNOSIS — M79675 Pain in left toe(s): Secondary | ICD-10-CM

## 2023-01-19 DIAGNOSIS — M25562 Pain in left knee: Secondary | ICD-10-CM | POA: Diagnosis not present

## 2023-01-19 NOTE — Progress Notes (Signed)
Subjective:   Patient ID: Terri Miranda, female   DOB: 77 y.o.   MRN: [id number]   HPI Patient presents with nail disease 1-5 both feet that are thick and can be a bother for her neurovascular   ROS      Objective:  Physical Exam  Status intact thick yellow brittle nailbeds 1-5 both feet right hallux nail has a crack line and that is on the medial side but not bothersome and not loose      Assessment:  Mycotic nail infection 1-5 both feet     Plan:  Debride nailbeds 1-5 both feet Neutra genic bleeding reappoint routine care

## 2023-01-24 DIAGNOSIS — E119 Type 2 diabetes mellitus without complications: Secondary | ICD-10-CM | POA: Diagnosis not present

## 2023-02-06 ENCOUNTER — Other Ambulatory Visit: Payer: Self-pay | Admitting: *Deleted

## 2023-02-06 DIAGNOSIS — M7989 Other specified soft tissue disorders: Secondary | ICD-10-CM

## 2023-02-13 DIAGNOSIS — Z23 Encounter for immunization: Secondary | ICD-10-CM | POA: Diagnosis not present

## 2023-02-21 ENCOUNTER — Ambulatory Visit (HOSPITAL_COMMUNITY)
Admission: RE | Admit: 2023-02-21 | Discharge: 2023-02-21 | Disposition: A | Discharge status: Home / Self Care | Payer: Medicare HMO | Source: Ambulatory Visit | Attending: Vascular Surgery | Admitting: Vascular Surgery

## 2023-02-21 ENCOUNTER — Ambulatory Visit (INDEPENDENT_AMBULATORY_CARE_PROVIDER_SITE_OTHER): Payer: Medicare HMO

## 2023-02-21 VITALS — BP 153/78 | HR 67 | Temp 97.8°F | Resp 18 | Ht 67.5 in | Wt 228.8 lb

## 2023-02-21 DIAGNOSIS — I872 Venous insufficiency (chronic) (peripheral): Secondary | ICD-10-CM

## 2023-02-21 DIAGNOSIS — M7989 Other specified soft tissue disorders: Secondary | ICD-10-CM | POA: Diagnosis not present

## 2023-02-21 NOTE — Progress Notes (Signed)
[facility] [place]   Reason for referral: Swollen B legs  History of Present Illness  Terri Miranda is a 77 y.o. female who presents with chief complaint: swollen leg.  Patient notes, onset of swelling > 10 years months ago, associated with sedentary life style.  The patient has had no history of DVT, no history of varicose vein, no history of venous stasis ulcers, no history of  Lymphedema and no history of skin changes in lower legs.  There is unknown family history of venous disorders.  The patient has used compression stockings in the past.  She has had swelling for years that progresses throughout the day.  She is not very active.  She is able to walk around [place] pushing a shopping cart.  She denies claudication, rest pain or non healing wounds.  She does have a few areas of spider veins.     Past Medical History:  Diagnosis Date   Diabetes mellitus without complication ([facility])    Essential hypertension    Mitral valve prolapse    Mixed hyperlipidemia    Palpitations    Pre-diabetes     Past Surgical History:  Procedure Laterality Date   BREAST EXCISIONAL BIOPSY Right    COLONOSCOPY N/A 11/14/2018   Procedure: COLONOSCOPY;  Surgeon: Malissa Hippo, MD;  [facility];  Service: Endoscopy;  Laterality: N/A;  1030   COLONOSCOPY WITH PROPOFOL N/A 12/25/2019   Procedure: COLONOSCOPY WITH PROPOFOL;  Surgeon: Dolores Frame, MD;  [facility];  Service: Gastroenterology;  Laterality: N/A;  1015   POLYPECTOMY  11/14/2018   Procedure: POLYPECTOMY;  Surgeon: Malissa Hippo, MD;  [facility];  Service: Endoscopy;;  colon   POLYPECTOMY  12/25/2019   Procedure: POLYPECTOMY;  Surgeon: Dolores Frame, MD;  [facility];  Service: Gastroenterology;;    Social History   Socioeconomic History   Marital status: Married    Spouse name: JV   Number of children: Not on file   Years of education: Not  on file   Highest education level: Associate degree: academic program  Occupational History    Employer: RETIRED  Tobacco Use   Smoking status: Never   Smokeless tobacco: Never  Vaping Use   Vaping status: Never Used  Substance and Sexual Activity   Alcohol use: Yes    Comment: Occasional wine   Drug use: Not Currently   Sexual activity: Not Currently  Other Topics Concern   Not on file  Social History Narrative   Not on file   Social Determinants of Health   Financial Resource Strain: Low Risk  (07/18/2019)   Overall Financial Resource Strain (CARDIA)    Difficulty of Paying Living Expenses: Not very hard  Food Insecurity: No Food Insecurity (03/31/2021)   Hunger Vital Sign    Worried About Running Out of Food in the Last Year: Never true    Ran Out of Food in the Last Year: Never true  Transportation Needs: No Transportation Needs (03/31/2021)   PRAPARE - Administrator, Civil Service (Medical): No    Lack of Transportation (Non-Medical): No  Physical Activity: Not on file  Stress: No Stress Concern Present (07/18/2019)   [employer] of Occupational Health - Occupational Stress Questionnaire    Feeling of Stress : Only a little  Social Connections: Not on file  Intimate Partner Violence: Not on file    Family History  Problem Relation Age  of Onset   Hypertension Mother    CVA Mother    Coronary artery disease Father    Prostate cancer Father    Hypertension Sister    Coronary artery disease Sister    Breast cancer Maternal Aunt     Current Outpatient Medications on File Prior to Visit  Medication Sig Dispense Refill   acetaminophen (TYLENOL) 500 MG tablet Take 500 mg by mouth every 6 (six) hours as needed for moderate pain or headache.     allopurinol (ZYLOPRIM) 100 MG tablet Take 100 mg by mouth daily.     aspirin EC 81 MG tablet Take 1 tablet (81 mg total) by mouth daily.     Carboxymethylcellul-Glycerin (LUBRICATING EYE DROPS OP) Place 1 drop  into both eyes daily as needed (dry eyes).      carvedilol (COREG) 6.25 MG tablet Take 6.25 mg by mouth 2 (two) times daily with a meal.     cholecalciferol (VITAMIN D3) 25 MCG (1000 UT) tablet Take 1,000 Units by mouth daily.     diclofenac sodium (VOLTAREN) 1 % GEL Apply 2 g topically 2 (two) times daily as needed (knee pain).      enalapril (VASOTEC) 10 MG tablet Take 10 mg by mouth daily.      hydrochlorothiazide (HYDRODIURIL) 25 MG tablet Take 25 mg by mouth daily.     loratadine (CLARITIN) 10 MG tablet Take 10 mg by mouth daily as needed for allergies.     metFORMIN (GLUCOPHAGE-XR) 500 MG 24 hr tablet Take 500 mg by mouth at bedtime.      oxymetazoline (AFRIN) 0.05 % nasal spray Place 2 sprays into the nose daily as needed for congestion. (Patient not taking: Reported on 02/21/2023)     No current facility-administered medications on file prior to visit.    Allergies as of 02/21/2023 - Review Complete 02/21/2023  Allergen Reaction Noted   Shellfish allergy Anaphylaxis and Hives 12/14/2011   Lovastatin       ROS:   General:  No weight loss, Fever, chills  HEENT: No recent headaches, no nasal bleeding, no visual changes, no sore throat  Neurologic: No dizziness, blackouts, seizures. No recent symptoms of stroke or mini- stroke. No recent episodes of slurred speech, or temporary blindness.  Cardiac: No recent episodes of chest pain/pressure, no shortness of breath at rest.  No shortness of breath with exertion.  Denies history of atrial fibrillation or irregular heartbeat  Vascular: No history of rest pain in feet.  No history of claudication.  No history of non-healing ulcer, No history of DVT   Pulmonary: No home oxygen, no productive cough, no hemoptysis,  No asthma or wheezing  Musculoskeletal:  [x ] Arthritis, [ ]  Low back pain,  [ x] Joint pain  Hematologic:No history of hypercoagulable state.  No history of easy bleeding.  No history of anemia  Gastrointestinal: No  hematochezia or melena,  No gastroesophageal reflux, no trouble swallowing  Urinary: [ ]  chronic Kidney disease, [ ]  on HD - [ ]  MWF or [ ]  TTHS, [ ]  Burning with urination, [ ]  Frequent urination, [ ]  Difficulty urinating;   Skin: No rashes  Psychological: No history of anxiety,  No history of depression  Physical Examination  Vitals:   02/21/23 1414  BP: (!) 153/78  Pulse: 67  Resp: 18  Temp: 97.8 F (36.6 C)  TempSrc: Temporal  SpO2: 96%  Weight: 228 lb 12.8 oz (103.8 kg)  Height: 5' 7.5" (1.715 m)  Body mass index is 35.31 kg/m.  General:  Alert and oriented, no acute distress HEENT: Normal Neck: No bruit or JVD Pulmonary: Clear to auscultation bilaterally Cardiac: Regular Rate and Rhythm without murmur Abdomen: Soft, non-tender, non-distended, no mass, no scars Skin: No rash Extremity Pulses:  2+ radial,  dorsalis pedis  pulses bilaterally Musculoskeletal: No deformity, positive B LE edema  Neurologic: Upper and lower extremity motor 5/5 and symmetric  DATA: +--------------+---------+------+-----------+------------+--------+  LEFT         Reflux NoRefluxReflux TimeDiameter cmsComments                          Yes                                   +--------------+---------+------+-----------+------------+--------+  CFV                    yes   >1 second                       +--------------+---------+------+-----------+------------+--------+  FV mid        no                                              +--------------+---------+------+-----------+------------+--------+  Popliteal    no                                              +--------------+---------+------+-----------+------------+--------+  GSV at [facility]    no                            0.63              +--------------+---------+------+-----------+------------+--------+  GSV prox thighno                            0.45               +--------------+---------+------+-----------+------------+--------+  GSV mid thigh           yes    >500 ms      0.39              +--------------+---------+------+-----------+------------+--------+  GSV dist thigh          yes    >500 ms      0.40              +--------------+---------+------+-----------+------------+--------+  GSV at knee             yes    >500 ms      0.49              +--------------+---------+------+-----------+------------+--------+  GSV prox calf           yes    >500 ms      0.34              +--------------+---------+------+-----------+------------+--------+  SSV Pop Fossa no                            0.22              +--------------+---------+------+-----------+------------+--------+  Summary:  Left:  - No evidence of deep vein thrombosis from the common femoral through the  popliteal veins.  - No evidence of superficial venous thrombosis.  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  - The great saphenous vein is incompetent.  - The small saphenous vein is competent.   Assessment/Plan: Venous reflux deep CFV and GSV.  There is no SFJ reflux and the vein size is  < 0.4cm.  She has no evidence of DVT or history of DVT.   No venous intervention is indicated.  She has worn thigh high compression for years.  The last time she purchased some was a year ago.    She has palpable pedal pulses.  She is not at risk of limb loss.  I gave her a vein handout for elevation PRN at rest for edema, exercise, and compression daily.  Conservative management.  If she develops new symptoms or weeping or venous stasis wounds she will call our office for repeat studies.      Mosetta Pigeon PA-C [facility] [place] Office: [phone]  MD in clinic [place]

## 2023-04-03 ENCOUNTER — Telehealth (INDEPENDENT_AMBULATORY_CARE_PROVIDER_SITE_OTHER): Payer: Self-pay | Admitting: Gastroenterology

## 2023-04-03 DIAGNOSIS — Z1211 Encounter for screening for malignant neoplasm of colon: Secondary | ICD-10-CM

## 2023-04-03 NOTE — Telephone Encounter (Signed)
 Ok to schedule.  Room 1/2  Thanks,  Vista Lawman, MD Gastroenterology and Hepatology [facility]

## 2023-04-03 NOTE — Telephone Encounter (Signed)
Who is your primary care physician: Dr V  Reasons for the colonoscopy: 3 year recall  Have you had a colonoscopy before?  yes  Do you have family history of colon cancer? no  Previous colonoscopy with polyps removed? yes  Do you have a history colorectal cancer?   no  Are you diabetic? If yes, Type 1 or Type 2?    yes  Do you have a prosthetic or mechanical heart valve? no  Do you have a pacemaker/defibrillator?   no  Have you had endocarditis/atrial fibrillation? no  Have you had joint replacement within the last 12 months?  no  Do you tend to be constipated or have to use laxatives? yes  Do you have any history of drugs or alchohol?  no  Do you use supplemental oxygen?  no  Have you had a stroke or heart attack within the last 6 months? no  Do you take weight loss medication?  no  For female patients: have you had a hysterectomy?  no                                     are you post menopausal?       no                                            do you still have your menstrual cycle? no      Do you take any blood-thinning medications such as: (aspirin, warfarin, Plavix, Aggrenox)  yes aspirin  If yes we need the name, milligram, dosage and who is prescribing doctor low 81mg  aspirin Current Outpatient Medications on File Prior to Visit  Medication Sig Dispense Refill   allopurinol (ZYLOPRIM) 100 MG tablet Take 100 mg by mouth daily.     aspirin EC 81 MG tablet Take 1 tablet (81 mg total) by mouth daily.     Carboxymethylcellul-Glycerin (LUBRICATING EYE DROPS OP) Place 1 drop into both eyes daily as needed (dry eyes).      carvedilol (COREG) 6.25 MG tablet Take 6.25 mg by mouth 2 (two) times daily with a meal.     cholecalciferol (VITAMIN D3) 25 MCG (1000 UT) tablet Take 1,000 Units by mouth daily.     diclofenac sodium (VOLTAREN) 1 % GEL Apply 2 g topically 2 (two) times daily as needed (knee pain).      enalapril (VASOTEC) 10 MG tablet Take 10 mg by mouth daily.       hydrochlorothiazide (HYDRODIURIL) 25 MG tablet Take 25 mg by mouth daily.     loratadine (CLARITIN) 10 MG tablet Take 10 mg by mouth daily as needed for allergies.     metFORMIN (GLUCOPHAGE-XR) 500 MG 24 hr tablet Take 500 mg by mouth at bedtime.      pravastatin (PRAVACHOL) 20 MG tablet Take 20 mg by mouth daily.     acetaminophen (TYLENOL) 500 MG tablet Take 500 mg by mouth every 6 (six) hours as needed for moderate pain or headache. (Patient not taking: Reported on 04/03/2023)     oxymetazoline (AFRIN) 0.05 % nasal spray Place 2 sprays into the nose daily as needed for congestion. (Patient not taking: Reported on 02/21/2023)     No current facility-administered medications on file prior to visit.    Allergies  Allergen  Reactions   Shellfish Allergy Anaphylaxis and Hives   Lovastatin     myalgias     Pharmacy: [facility]  Primary Insurance Name: [employer] number where you can be reached: [phone]

## 2023-04-10 NOTE — Telephone Encounter (Signed)
Pt left voicemail. Returned call to patient. Pt had TCS with Dr.Castaneda 12/25/19 and pt would like Dr.Castaneda to do this TCS. Ok to schedule with you? Please advise. Thank you

## 2023-04-10 NOTE — Telephone Encounter (Signed)
Yes please, I did her previous colonoscopy in 2021 Thanks

## 2023-04-11 MED ORDER — PLENVU 140 G PO SOLR: ORAL | 0 refills | Status: DC

## 2023-04-11 NOTE — Telephone Encounter (Signed)
Questionnaire from recall, no referral needed

## 2023-04-11 NOTE — Telephone Encounter (Signed)
Pt contacted and scheduled with Dr.Castaneda. instructions mailed to patient. Pt requested Plenvu-sent to pharmacy. No pa needed.

## 2023-04-11 NOTE — Addendum Note (Signed)
Addended by: Marlowe Shores on: 04/11/2023 11:04 AM   Modules accepted: Orders

## 2023-04-21 ENCOUNTER — Other Ambulatory Visit (HOSPITAL_COMMUNITY)
Admission: RE | Admit: 2023-04-21 | Discharge: 2023-04-21 | Disposition: A | Discharge status: Home / Self Care | Payer: Medicare HMO | Source: Ambulatory Visit | Attending: Gastroenterology | Admitting: Gastroenterology

## 2023-04-21 DIAGNOSIS — Z1211 Encounter for screening for malignant neoplasm of colon: Secondary | ICD-10-CM | POA: Insufficient documentation

## 2023-04-21 LAB — BASIC METABOLIC PANEL
Anion gap: 9 (ref 5–15)
BUN: 21 mg/dL (ref 8–23)
CO2: 28 mmol/L (ref 22–32)
Calcium: 9.3 mg/dL (ref 8.9–10.3)
Chloride: 97 mmol/L — ABNORMAL LOW (ref 98–111)
Creatinine, Ser: 1.12 mg/dL — ABNORMAL HIGH (ref 0.44–1.00)
GFR, Estimated: 51 mL/min — ABNORMAL LOW (ref >60)
Glucose, Bld: 118 mg/dL — ABNORMAL HIGH (ref 70–99)
Potassium: 4 mmol/L (ref 3.5–5.1)
Sodium: 134 mmol/L — ABNORMAL LOW (ref 135–145)

## 2023-04-26 ENCOUNTER — Other Ambulatory Visit: Payer: Self-pay

## 2023-04-26 ENCOUNTER — Ambulatory Visit (HOSPITAL_COMMUNITY)
Admission: RE | Admit: 2023-04-26 | Discharge: 2023-04-26 | Disposition: A | Discharge status: Home / Self Care | Payer: Medicare HMO | Attending: Gastroenterology | Admitting: Gastroenterology

## 2023-04-26 ENCOUNTER — Ambulatory Visit (HOSPITAL_COMMUNITY): Payer: Medicare HMO | Admitting: Anesthesiology

## 2023-04-26 ENCOUNTER — Encounter (HOSPITAL_COMMUNITY): Payer: Self-pay | Admitting: Gastroenterology

## 2023-04-26 ENCOUNTER — Encounter (HOSPITAL_COMMUNITY)
Admission: RE | Disposition: A | Discharge status: Home / Self Care | Payer: Self-pay | Source: Home / Self Care | Attending: Gastroenterology

## 2023-04-26 DIAGNOSIS — K5730 Diverticulosis of large intestine without perforation or abscess without bleeding: Secondary | ICD-10-CM

## 2023-04-26 DIAGNOSIS — Z1211 Encounter for screening for malignant neoplasm of colon: Secondary | ICD-10-CM | POA: Insufficient documentation

## 2023-04-26 DIAGNOSIS — I10 Essential (primary) hypertension: Secondary | ICD-10-CM | POA: Insufficient documentation

## 2023-04-26 DIAGNOSIS — D120 Benign neoplasm of cecum: Secondary | ICD-10-CM | POA: Diagnosis not present

## 2023-04-26 DIAGNOSIS — D123 Benign neoplasm of transverse colon: Secondary | ICD-10-CM | POA: Diagnosis not present

## 2023-04-26 DIAGNOSIS — E119 Type 2 diabetes mellitus without complications: Secondary | ICD-10-CM | POA: Diagnosis not present

## 2023-04-26 DIAGNOSIS — K648 Other hemorrhoids: Secondary | ICD-10-CM | POA: Insufficient documentation

## 2023-04-26 DIAGNOSIS — Z860100 Personal history of colon polyps, unspecified: Secondary | ICD-10-CM

## 2023-04-26 DIAGNOSIS — K635 Polyp of colon: Secondary | ICD-10-CM | POA: Diagnosis not present

## 2023-04-26 DIAGNOSIS — Z860101 Personal history of adenomatous and serrated colon polyps: Secondary | ICD-10-CM

## 2023-04-26 DIAGNOSIS — Z7984 Long term (current) use of oral hypoglycemic drugs: Secondary | ICD-10-CM | POA: Diagnosis not present

## 2023-04-26 HISTORY — PX: COLONOSCOPY WITH PROPOFOL: SHX5780

## 2023-04-26 HISTORY — PX: HEMOSTASIS CLIP PLACEMENT: SHX6857

## 2023-04-26 HISTORY — PX: POLYPECTOMY: SHX5525

## 2023-04-26 HISTORY — DX: Other specified postprocedural states: Z98.890

## 2023-04-26 HISTORY — PX: SUBMUCOSAL LIFTING INJECTION: SHX6855

## 2023-04-26 LAB — GLUCOSE, CAPILLARY
Glucose-Capillary: 106 mg/dL — ABNORMAL HIGH (ref 70–99)
Glucose-Capillary: 59 mg/dL — ABNORMAL LOW (ref 70–99)
Glucose-Capillary: 109 mg/dL — ABNORMAL HIGH (ref 70–99)

## 2023-04-26 LAB — HM COLONOSCOPY

## 2023-04-26 SURGERY — COLONOSCOPY WITH PROPOFOL
Anesthesia: General

## 2023-04-26 MED ORDER — LIDOCAINE HCL (CARDIAC) PF 100 MG/5ML IV SOSY
PREFILLED_SYRINGE | INTRAVENOUS | Status: DC | PRN
  Administered 2023-04-26: 50 mg via INTRATRACHEAL

## 2023-04-26 MED ORDER — LACTATED RINGERS IV SOLN
INTRAVENOUS | Status: DC
Start: 2023-04-26 — End: 2023-04-26

## 2023-04-26 MED ORDER — PHENYLEPHRINE HCL (PRESSORS) 10 MG/ML IV SOLN
INTRAVENOUS | Status: DC | PRN
  Administered 2023-04-26 (×2): 80 ug via INTRAVENOUS

## 2023-04-26 MED ORDER — FENTANYL CITRATE (PF) 100 MCG/2ML IJ SOLN: 25.0000 ug | INTRAMUSCULAR | Status: DC | PRN

## 2023-04-26 MED ORDER — DEXTROSE 50 % IV SOLN
INTRAVENOUS | Status: AC
  Filled 2023-04-26: qty 50

## 2023-04-26 MED ORDER — DEXTROSE 50 % IV SOLN
12.5000 g | Freq: Once | INTRAVENOUS | Status: AC
  Administered 2023-04-26: 12.5 g via INTRAVENOUS

## 2023-04-26 MED ORDER — PROPOFOL 10 MG/ML IV BOLUS
INTRAVENOUS | Status: DC | PRN
  Administered 2023-04-26: 125 ug/kg/min via INTRAVENOUS

## 2023-04-26 MED ORDER — METOCLOPRAMIDE HCL 5 MG/ML IJ SOLN: 10.0000 mg | Freq: Once | INTRAMUSCULAR | Status: DC | PRN

## 2023-04-26 MED ORDER — EPHEDRINE SULFATE (PRESSORS) 50 MG/ML IJ SOLN
INTRAMUSCULAR | Status: DC | PRN
  Administered 2023-04-26 (×2): 5 mg via INTRAVENOUS

## 2023-04-26 NOTE — Anesthesia Postprocedure Evaluation (Signed)
Anesthesia Post Note  Patient: Terri Miranda  Procedure(s) Performed: COLONOSCOPY WITH PROPOFOL POLYPECTOMY SUBMUCOSAL LIFTING INJECTION HEMOSTASIS CLIP PLACEMENT  Patient location during evaluation: PACU Anesthesia Type: General Level of consciousness: awake and alert Pain management: pain level controlled Vital Signs Assessment: post-procedure vital signs reviewed and stable Respiratory status: spontaneous breathing, nonlabored ventilation and respiratory function stable Cardiovascular status: blood pressure returned to baseline and stable Postop Assessment: no apparent nausea or vomiting Anesthetic complications: no   No notable events documented.   Last Vitals:  Vitals:   04/26/23 1415 04/26/23 1430  BP: 109/64 115/70  Pulse: 82 77  Resp: (!) 35 (!) 25  Temp: 36.6 C   SpO2: 99% 93%    Last Pain:  Vitals:   04/26/23 1434  TempSrc: Axillary  PainSc:                  Roslynn Amble

## 2023-04-26 NOTE — Discharge Instructions (Addendum)
You are being discharged to home.  Resume your previous diet.  We are waiting for your pathology results.  Your physician has recommended a repeat colonoscopy in three years for surveillance.

## 2023-04-26 NOTE — H&P (Signed)
Terri Miranda is an 78 y.o. female.   Chief Complaint: History of colonic polyps HPI: 78 year old female with past medical history of constipation, essential hypertension, hyperlipidemia, and history of colonic polyps, who comes to the hospital for follow-up of colonic polyps.   The patient denies having any nausea, vomiting, fever, chills, hematochezia, melena, hematemesis, abdominal distention, abdominal pain, diarrhea, jaundice, pruritus or weight loss.   Past Medical History:  Diagnosis Date   Diabetes mellitus without complication (HCC)    Essential hypertension    Mitral valve prolapse    Mixed hyperlipidemia    Palpitations    PONV (postoperative nausea and vomiting)    Pre-diabetes     Past Surgical History:  Procedure Laterality Date   BREAST EXCISIONAL BIOPSY Right    COLONOSCOPY N/A 11/14/2018   Procedure: COLONOSCOPY;  Surgeon: Malissa Hippo, MD;  [facility];  Service: Endoscopy;  Laterality: N/A;  1030   COLONOSCOPY WITH PROPOFOL N/A 12/25/2019   Procedure: COLONOSCOPY WITH PROPOFOL;  Surgeon: Dolores Frame, MD;  [facility];  Service: Gastroenterology;  Laterality: N/A;  1015   POLYPECTOMY  11/14/2018   Procedure: POLYPECTOMY;  Surgeon: Malissa Hippo, MD;  [facility];  Service: Endoscopy;;  colon   POLYPECTOMY  12/25/2019   Procedure: POLYPECTOMY;  Surgeon: Dolores Frame, MD;  [facility];  Service: Gastroenterology;;   REPLACEMENT TOTAL KNEE Right     Family History  Problem Relation Age of Onset   Hypertension Mother    CVA Mother    Coronary artery disease Father    Prostate cancer Father    Hypertension Sister    Coronary artery disease Sister    Breast cancer Maternal Aunt    Social History:  reports that she has never smoked. She has never used smokeless tobacco. She reports current alcohol use. She reports that she does not currently use drugs.  Allergies:  Allergies   Allergen Reactions   Shellfish Allergy Anaphylaxis and Hives   Lovastatin     myalgias    Medications Prior to Admission  Medication Sig Dispense Refill   allopurinol (ZYLOPRIM) 100 MG tablet Take 100 mg by mouth daily.     aspirin EC 81 MG tablet Take 1 tablet (81 mg total) by mouth daily.     Carboxymethylcellul-Glycerin (LUBRICATING EYE DROPS OP) Place 1 drop into both eyes daily as needed (dry eyes).      carvedilol (COREG) 6.25 MG tablet Take 6.25 mg by mouth 2 (two) times daily with a meal.     cholecalciferol (VITAMIN D3) 25 MCG (1000 UT) tablet Take 1,000 Units by mouth daily.     diclofenac sodium (VOLTAREN) 1 % GEL Apply 2 g topically 2 (two) times daily as needed (knee pain).      enalapril (VASOTEC) 10 MG tablet Take 10 mg by mouth daily.      hydrochlorothiazide (HYDRODIURIL) 25 MG tablet Take 25 mg by mouth daily.     metFORMIN (GLUCOPHAGE-XR) 500 MG 24 hr tablet Take 500 mg by mouth at bedtime.      PEG-KCl-NaCl-NaSulf-Na Asc-C (PLENVU) 140 g SOLR Use as directed 3 each 0   pravastatin (PRAVACHOL) 20 MG tablet Take 20 mg by mouth daily.     acetaminophen (TYLENOL) 500 MG tablet Take 500 mg by mouth every 6 (six) hours as needed for moderate pain or headache. (Patient not taking: Reported on 04/03/2023)     loratadine (CLARITIN) 10 MG tablet Take  10 mg by mouth daily as needed for allergies.     oxymetazoline (AFRIN) 0.05 % nasal spray Place 2 sprays into the nose daily as needed for congestion. (Patient not taking: Reported on 02/21/2023)      No results found for this or any previous visit (from the past 48 hours). No results found.  Review of Systems  All other systems reviewed and are negative.   Blood pressure (!) 148/64, pulse 72, temperature 98.5 F (36.9 C), temperature source Oral, resp. rate 12, height 5\' 7"  (1.702 m), weight 99.8 kg, SpO2 99%. Physical Exam  GENERAL: The patient is AO x3, in no acute distress. HEENT: Head is normocephalic and atraumatic.  EOMI are intact. Mouth is well hydrated and without lesions. NECK: Supple. No masses LUNGS: Clear to auscultation. No presence of rhonchi/wheezing/rales. Adequate chest expansion HEART: RRR, normal s1 and s2. ABDOMEN: Soft, nontender, no guarding, no peritoneal signs, and nondistended. BS +. No masses. EXTREMITIES: Without any cyanosis, clubbing, rash, lesions or edema. NEUROLOGIC: AOx3, no focal motor deficit. SKIN: no jaundice, no rashes  Assessment/Plan 78 year old female with past medical history of constipation, essential hypertension, hyperlipidemia, and history of colonic polyps, who comes to the hospital for follow-up of colonic polyps.  Will proceed with colonoscopy.  Dolores Frame, MD 04/26/2023, 12:45 PM

## 2023-04-26 NOTE — Op Note (Signed)
[facility] Patient Name: Terri Miranda Procedure Date: 04/26/2023 1:33 PM MRN: [id number] Date of Birth: 1945/08/23 Attending MD: Katrinka Blazing , , [id number] CSN: [id number] Age: 78 Admit Type: Outpatient Procedure:                Colonoscopy Indications:              Surveillance: Personal history of adenomatous                            polyps on last colonoscopy 3 years ago, High risk                            colon cancer surveillance: Personal history of                            sessile serrated colon polyp (less than 10 mm in                            size) with no dysplasia Providers:                Katrinka Blazing, Francoise Ceo RN, RN, Elinor Parkinson Referring MD:              Medicines:                Monitored Anesthesia Care Complications:            No immediate complications. Estimated Blood Loss:     Estimated blood loss: none. Procedure:                Pre-Anesthesia Assessment:                           - Prior to the procedure, a History and Physical                            was performed, and patient medications, allergies                            and sensitivities were reviewed. The patient's                            tolerance of previous anesthesia was reviewed.                           - The risks and benefits of the procedure and the                            sedation options and risks were discussed with the                            patient. All questions were answered and informed                            consent  was obtained.                           - ASA Grade Assessment: II - A patient with mild                            systemic disease.                           After obtaining informed consent, the colonoscope                            was passed under direct vision. Throughout the                            procedure, the patient's blood pressure, pulse, and                            oxygen  saturations were monitored continuously. The                            [id number]-HQ190L ([id number]) scope was introduced through                            the anus and advanced to the the cecum, identified                            by appendiceal orifice and ileocecal valve. The                            colonoscopy was performed without difficulty. The                            patient tolerated the procedure well. The quality                            of the bowel preparation was excellent. Scope In: 1:46:06 PM Scope Out: 2:29:40 PM Scope Withdrawal Time: 0 hours 35 minutes 11 seconds  Total Procedure Duration: 0 hours 43 minutes 34 seconds  Findings:      The perianal and digital rectal examinations were normal.      An 8 mm polyp was found in the cecum. The polyp was semi-sessile. Area       was successfully injected with 1 mL Eleview for a lift polypectomy.       Imaging was performed using white light and narrow band imaging to       visualize the mucosa and demarcate the polyp site after injection for       EMR purposes. The polyp was removed with a cold snare. Resection and       retrieval were complete. To prevent bleeding after the polypectomy, two       hemostatic clips were successfully placed. Clip manufacturer: [employer]. There was no bleeding at the end of the procedure.      Two sessile polyps were found in the transverse colon. The polyps were 2  to 5 mm in size. These polyps were removed with a cold snare. Resection       and retrieval were complete.      A 4 mm polyp was found in the descending colon. The polyp was sessile.       The polyp was removed with a cold snare. Resection and retrieval were       complete.      Scattered medium-mouthed and small-mouthed diverticula were found in the       sigmoid colon and descending colon.      Non-bleeding internal hemorrhoids were found during retroflexion. The       hemorrhoids were small. Impression:                - One 8 mm polyp in the cecum, removed with a cold                            snare. Resected and retrieved. Injected. Clips were                            placed. Clip manufacturer: [employer].                           - Two 2 to 5 mm polyps in the transverse colon,                            removed with a cold snare. Resected and retrieved.                           - One 4 mm polyp in the descending colon, removed                            with a cold snare. Resected and retrieved.                           - Diverticulosis in the sigmoid colon and in the                            descending colon.                           - Non-bleeding internal hemorrhoids. Moderate Sedation:      Per Anesthesia Care Recommendation:           - Discharge patient to home (ambulatory).                           - Resume previous diet.                           - Await pathology results.                           - Repeat colonoscopy in 3 years for surveillance. Procedure Code(s):        --- Professional ---  16109, 59, Colonoscopy, flexible; with removal of                            tumor(s), polyp(s), or other lesion(s) by snare                            technique                           45381, Colonoscopy, flexible; with directed                            submucosal injection(s), any substance Diagnosis Code(s):        --- Professional ---                           D12.0, Benign neoplasm of cecum                           D12.4, Benign neoplasm of descending colon                           D12.3, Benign neoplasm of transverse colon (hepatic                            flexure or splenic flexure)                           K64.8, Other hemorrhoids                           Z86.010, Personal history of colonic polyps                           K57.30, Diverticulosis of large intestine without                            perforation or abscess without  bleeding CPT copyright 2022 American [employer] Association. All rights reserved. The codes documented in this report are preliminary and upon coder review may  be revised to meet current compliance requirements. Katrinka Blazing, MD Katrinka Blazing,  04/26/2023 2:43:22 PM This report has been signed electronically. Number of Addenda: 0

## 2023-04-26 NOTE — Transfer of Care (Signed)
Immediate Anesthesia Transfer of Care Note  Patient: Terri Miranda  Procedure(s) Performed: COLONOSCOPY WITH PROPOFOL POLYPECTOMY SUBMUCOSAL LIFTING INJECTION HEMOSTASIS CLIP PLACEMENT  Patient Location: PACU  Anesthesia Type:General  Level of Consciousness: awake, alert , and patient cooperative  Airway & Oxygen Therapy: Patient connected to nasal cannula oxygen  Post-op Assessment: Report given to RN, Post -op Vital signs reviewed and stable, and Patient moving all extremities X 4  Post vital signs: Reviewed and stable  Last Vitals:  Vitals Value Taken Time  BP 103/57 04/26/23 1434  Temp 36.5 C 04/26/23 1434  Pulse 80 04/26/23 1436  Resp 17 04/26/23 1436  SpO2 98 % 04/26/23 1436  Vitals shown include unfiled device data.  Last Pain:  Vitals:   04/26/23 1434  TempSrc: Axillary  PainSc: 0-No pain      Patients Stated Pain Goal: 8 (04/26/23 1230)  Complications: No notable events documented.

## 2023-04-26 NOTE — Anesthesia Preprocedure Evaluation (Signed)
Anesthesia Evaluation  Patient identified by MRN, date of birth, ID band Patient awake    Reviewed: Allergy & Precautions, H&P , NPO status , Patient's Chart, lab work & pertinent test results  History of Anesthesia Complications (+) PONV and history of anesthetic complications  Airway Mallampati: II  TM Distance: >3 FB Neck ROM: Full    Dental no notable dental hx.    Pulmonary neg pulmonary ROS   Pulmonary exam normal breath sounds clear to auscultation       Cardiovascular hypertension, Orthopnea: ..  negative cardio ROS Normal cardiovascular exam Rhythm:Regular Rate:Normal     Neuro/Psych negative neurological ROS  negative psych ROS   GI/Hepatic negative GI ROS, Neg liver ROS,,,  Endo/Other  negative endocrine ROSdiabetes    Renal/GU negative Renal ROS  negative genitourinary   Musculoskeletal negative musculoskeletal ROS (+)    Abdominal  (+) + obese  Peds negative pediatric ROS (+)  Hematology negative hematology ROS (+)   Anesthesia Other Findings   Reproductive/Obstetrics negative OB ROS                             Anesthesia Physical Anesthesia Plan  ASA: 2  Anesthesia Plan: General   Post-op Pain Management: Minimal or no pain anticipated   Induction: Intravenous  PONV Risk Score and Plan: 1 and Propofol infusion  Airway Management Planned: Simple Face Mask and Nasal Cannula  Additional Equipment:   Intra-op Plan:   Post-operative Plan:   Informed Consent: I have reviewed the patients History and Physical, chart, labs and discussed the procedure including the risks, benefits and alternatives for the proposed anesthesia with the patient or authorized representative who has indicated his/her understanding and acceptance.       Plan Discussed with: CRNA  Anesthesia Plan Comments:        Anesthesia Quick Evaluation

## 2023-04-27 ENCOUNTER — Encounter (INDEPENDENT_AMBULATORY_CARE_PROVIDER_SITE_OTHER): Payer: Self-pay | Admitting: *Deleted

## 2023-04-27 ENCOUNTER — Encounter (HOSPITAL_COMMUNITY): Payer: Self-pay | Admitting: Gastroenterology

## 2023-04-28 ENCOUNTER — Ambulatory Visit: Payer: Medicare HMO | Attending: Cardiology | Admitting: Cardiology

## 2023-04-28 ENCOUNTER — Encounter: Payer: Self-pay | Admitting: Cardiology

## 2023-04-28 VITALS — BP 124/68 | HR 64 | Ht 67.5 in | Wt 231.0 lb

## 2023-04-28 DIAGNOSIS — I10 Essential (primary) hypertension: Secondary | ICD-10-CM | POA: Diagnosis not present

## 2023-04-28 DIAGNOSIS — I5189 Other ill-defined heart diseases: Secondary | ICD-10-CM | POA: Diagnosis not present

## 2023-04-28 DIAGNOSIS — Z79899 Other long term (current) drug therapy: Secondary | ICD-10-CM | POA: Diagnosis not present

## 2023-04-28 DIAGNOSIS — M7989 Other specified soft tissue disorders: Secondary | ICD-10-CM

## 2023-04-28 LAB — SURGICAL PATHOLOGY

## 2023-04-28 MED ORDER — FUROSEMIDE 40 MG PO TABS: 40.0000 mg | ORAL_TABLET | Freq: Every day | ORAL | 3 refills | Status: DC

## 2023-04-28 NOTE — Progress Notes (Addendum)
Clinical Summary Ms. Hiscox is a 78 y.o.female seen today for follow up of the following medical problems.      1. LE edema  increasing swelling she reports over the last few months 05/2020 echo: LVEF 65-70%, no WMAs, grade I dd.   - has lasix prn. Has not been taking - increased LE edema. She is up 10 lbs since 01/2023 -denies significant SOB/DOE       Past Medical History:  Diagnosis Date   Diabetes mellitus without complication (HCC)    Essential hypertension    Mitral valve prolapse    Mixed hyperlipidemia    Palpitations    PONV (postoperative nausea and vomiting)    Pre-diabetes      Allergies  Allergen Reactions   Shellfish Allergy Anaphylaxis and Hives   Lovastatin     myalgias     Current Outpatient Medications  Medication Sig Dispense Refill   acetaminophen (TYLENOL) 500 MG tablet Take 500 mg by mouth every 6 (six) hours as needed for moderate pain or headache. (Patient not taking: Reported on 04/03/2023)     allopurinol (ZYLOPRIM) 100 MG tablet Take 100 mg by mouth daily.     aspirin EC 81 MG tablet Take 1 tablet (81 mg total) by mouth daily.     Carboxymethylcellul-Glycerin (LUBRICATING EYE DROPS OP) Place 1 drop into both eyes daily as needed (dry eyes).      carvedilol (COREG) 6.25 MG tablet Take 6.25 mg by mouth 2 (two) times daily with a meal.     cholecalciferol (VITAMIN D3) 25 MCG (1000 UT) tablet Take 1,000 Units by mouth daily.     diclofenac sodium (VOLTAREN) 1 % GEL Apply 2 g topically 2 (two) times daily as needed (knee pain).      enalapril (VASOTEC) 10 MG tablet Take 10 mg by mouth daily.      hydrochlorothiazide (HYDRODIURIL) 25 MG tablet Take 25 mg by mouth daily.     loratadine (CLARITIN) 10 MG tablet Take 10 mg by mouth daily as needed for allergies.     metFORMIN (GLUCOPHAGE-XR) 500 MG 24 hr tablet Take 500 mg by mouth at bedtime.      oxymetazoline (AFRIN) 0.05 % nasal spray Place 2 sprays into the nose daily as needed for  congestion. (Patient not taking: Reported on 02/21/2023)     pravastatin (PRAVACHOL) 20 MG tablet Take 20 mg by mouth daily.     No current facility-administered medications for this visit.     Past Surgical History:  Procedure Laterality Date   BREAST EXCISIONAL BIOPSY Right    COLONOSCOPY N/A 11/14/2018   Procedure: COLONOSCOPY;  Surgeon: Malissa Hippo, MD;  [facility];  Service: Endoscopy;  Laterality: N/A;  1030   COLONOSCOPY WITH PROPOFOL N/A 12/25/2019   Procedure: COLONOSCOPY WITH PROPOFOL;  Surgeon: Dolores Frame, MD;  [facility];  Service: Gastroenterology;  Laterality: N/A;  1015   COLONOSCOPY WITH PROPOFOL N/A 04/26/2023   Procedure: COLONOSCOPY WITH PROPOFOL;  Surgeon: Dolores Frame, MD;  [facility];  Service: Gastroenterology;  Laterality: N/A;  1:45PM;ASA 1-2   HEMOSTASIS CLIP PLACEMENT  04/26/2023   Procedure: HEMOSTASIS CLIP PLACEMENT;  Surgeon: Dolores Frame, MD;  [facility];  Service: Gastroenterology;;   POLYPECTOMY  11/14/2018   Procedure: POLYPECTOMY;  Surgeon: Malissa Hippo, MD;  [facility];  Service: Endoscopy;;  colon   POLYPECTOMY  12/25/2019   Procedure: POLYPECTOMY;  Surgeon: Marguerita Merles, Reuel Boom, MD;  [facility];  Service: Gastroenterology;;   POLYPECTOMY  04/26/2023   Procedure: POLYPECTOMY;  Surgeon: Marguerita Merles, Reuel Boom, MD;  [facility];  Service: Gastroenterology;;   REPLACEMENT TOTAL KNEE Right    SUBMUCOSAL LIFTING INJECTION  04/26/2023   Procedure: SUBMUCOSAL LIFTING INJECTION;  Surgeon: Dolores Frame, MD;  [facility];  Service: Gastroenterology;;     Allergies  Allergen Reactions   Shellfish Allergy Anaphylaxis and Hives   Lovastatin     myalgias      Family History  Problem Relation Age of Onset   Hypertension Mother    CVA Mother    Coronary artery disease Father    Prostate  cancer Father    Hypertension Sister    Coronary artery disease Sister    Breast cancer Maternal Aunt      Social History Ms. Loeber reports that she has never smoked. She has never used smokeless tobacco. Ms. Kuchera reports current alcohol use.     Physical Examination Today's Vitals   04/28/23 0902  BP: 124/68  Pulse: 64  SpO2: 97%  Weight: 231 lb (104.8 kg)  Height: 5' 7.5" (1.715 m)   Body mass index is 35.65 kg/m.  Gen: resting comfortably, no acute distress HEENT: no scleral icterus, pupils equal round and reactive, no palptable cervical adenopathy,  CV: RRR, no mrg, no jvd Resp: Clear to auscultation bilaterally GI: abdomen is soft, non-tender, non-distended, normal bowel sounds, no hepatosplenomegaly MSK: extremities are warm, 2+ bilateral LE edema Skin: warm, no rash Neuro:  no focal deficits Psych: appropriate affect   Diagnostic Studies  04/2015 echo Study Conclusions   - Left ventricle: The cavity size was normal. There was focal basal    hypertrophy. Systolic function was normal. The estimated ejection    fraction was in the range of 55% to 60%. Doppler parameters are    consistent with abnormal left ventricular relaxation (grade 1    diastolic dysfunction).  - Aortic valve: Mildly calcified annulus. Trileaflet; normal    thickness leaflets. Valve area (VTI): 3.01 cm^2. Valve area    (Vmax): 2.92 cm^2.  - Atrial septum: No defect or patent foramen ovale was identified.  - Technically adequate study.       05/2020 nuclear stress No diagnostic ST segment changes to indicate ischemia. No significant myocardial perfusion defects to indicate scar or ischemia. There is breast attenuation artifact noted. This is a low risk study. Nuclear stress EF: 82%.   05/2020 echo 1. Left ventricular ejection fraction, by estimation, is 65 to 70%. The  left ventricle has normal function. The left ventricle has no regional  wall motion abnormalities. Left  ventricular diastolic parameters are  indeterminate.   2. Right ventricular systolic function is normal. The right ventricular  size is normal. Tricuspid regurgitation signal is inadequate for assessing  PA pressure.   3. The mitral valve is grossly normal. Trivial mitral valve  regurgitation.   4. The aortic valve is tricuspid. Aortic valve regurgitation is not  visualized.   5. The inferior vena cava is normal in size with greater than 50%  respiratory variability, suggesting right atrial pressure of 3 mmHg.    Assessment and Plan   1. LE edema - chronic edema several years - 2022 echo was essentially benign, indeterminant diastolic fxn. The reported E/A ratio would suggest perhaps some mild diastolic dysfunction.  - increased LE edema, up about 10 lbs since 01/2023 - d/c  hydrochlorothiazide, start lasix 40mg  daily. BMET/mg in 2 weeks - given worsening edema repeat echo  EKG today shows SR, chronic ST/T changes  2. HTN -at goal, continue current meds   Antoine Poche, M.D.

## 2023-04-28 NOTE — Patient Instructions (Signed)
Medication Instructions:  Your physician has recommended you make the following change in your medication:   -Stop Hydrochlorothiazide  -Start Lasix 40 mg tablet once daily   *If you need a refill on your cardiac medications before your next appointment, please call your pharmacy*   Lab Work: In 2 weeks:  -BMET -Mag  If you have labs (blood work) drawn today and your tests are completely normal, you will receive your results only by: MyChart Message (if you have MyChart) OR A paper copy in the mail If you have any lab test that is abnormal or we need to change your treatment, we will call you to review the results.   Testing/Procedures: Your physician has requested that you have an echocardiogram. Echocardiography is a painless test that uses sound waves to create images of your heart. It provides your doctor with information about the size and shape of your heart and how well your heart's chambers and valves are working. This procedure takes approximately one hour. There are no restrictions for this procedure. Please do NOT wear cologne, perfume, aftershave, or lotions (deodorant is allowed). Please arrive 15 minutes prior to your appointment time.  Please note: We ask at that you not bring children with you during ultrasound (echo/ vascular) testing. Due to room size and safety concerns, children are not allowed in the ultrasound rooms during exams. Our front office staff cannot provide observation of children in our lobby area while testing is being conducted. An adult accompanying a patient to their appointment will only be allowed in the ultrasound room at the discretion of the ultrasound technician under special circumstances. We apologize for any inconvenience.    Follow-Up: At [facility], you and your health needs are our priority.  As part of our continuing mission to provide you with exceptional heart care, we have created designated Provider Care Teams.  These  Care Teams include your primary Cardiologist (physician) and Advanced Practice Providers (APPs -  Physician Assistants and Nurse Practitioners) who all work together to provide you with the care you need, when you need it.  We recommend signing up for the patient portal called "MyChart".  Sign up information is provided on this After Visit Summary.  MyChart is used to connect with patients for Virtual Visits (Telemedicine).  Patients are able to view lab/test results, encounter notes, upcoming appointments, etc.  Non-urgent messages can be sent to your provider as well.   To learn more about what you can do with MyChart, go to [email or web address].    Your next appointment:   6 week(s)  Provider:   You may see Dina Rich, MD or one of the following Advanced Practice Providers on your designated Care Team:   Randall An, PA-C  Jacolyn Reedy, [place]     Other Instructions

## 2023-05-04 ENCOUNTER — Ambulatory Visit (HOSPITAL_COMMUNITY): Payer: Medicare HMO

## 2023-05-08 ENCOUNTER — Telehealth (INDEPENDENT_AMBULATORY_CARE_PROVIDER_SITE_OTHER): Payer: Self-pay

## 2023-05-08 NOTE — Telephone Encounter (Signed)
 Patient is calling for the results of her 04/26/2023 Tcs. Please advise.

## 2023-05-08 NOTE — Telephone Encounter (Signed)
 I called the patient to discuss pathology findings but she is not answer my call.  I left a detailed voice message. Terri Miranda will also send her a letter with the findings.

## 2023-05-09 ENCOUNTER — Encounter (INDEPENDENT_AMBULATORY_CARE_PROVIDER_SITE_OTHER): Payer: Self-pay | Admitting: *Deleted

## 2023-05-09 ENCOUNTER — Ambulatory Visit (HOSPITAL_COMMUNITY)
Admission: RE | Admit: 2023-05-09 | Discharge: 2023-05-09 | Disposition: A | Discharge status: Home / Self Care | Payer: Medicare HMO | Source: Ambulatory Visit | Attending: Cardiology | Admitting: Cardiology

## 2023-05-09 ENCOUNTER — Other Ambulatory Visit (HOSPITAL_COMMUNITY)
Admission: RE | Admit: 2023-05-09 | Discharge: 2023-05-09 | Disposition: A | Discharge status: Home / Self Care | Payer: Medicare HMO | Source: Ambulatory Visit | Attending: Cardiology | Admitting: Cardiology

## 2023-05-09 DIAGNOSIS — I5189 Other ill-defined heart diseases: Secondary | ICD-10-CM | POA: Diagnosis not present

## 2023-05-09 LAB — ECHOCARDIOGRAM COMPLETE
Area-P 1/2: 4.29 cm2
S' Lateral: 2.6 cm

## 2023-05-09 LAB — BASIC METABOLIC PANEL
Anion gap: 9 (ref 5–15)
BUN: 33 mg/dL — ABNORMAL HIGH (ref 8–23)
CO2: 28 mmol/L (ref 22–32)
Calcium: 9.4 mg/dL (ref 8.9–10.3)
Chloride: 98 mmol/L (ref 98–111)
Creatinine, Ser: 1.35 mg/dL — ABNORMAL HIGH (ref 0.44–1.00)
GFR, Estimated: 41 mL/min — ABNORMAL LOW (ref >60)
Glucose, Bld: 106 mg/dL — ABNORMAL HIGH (ref 70–99)
Potassium: 4 mmol/L (ref 3.5–5.1)
Sodium: 135 mmol/L (ref 135–145)

## 2023-05-09 LAB — MAGNESIUM: Magnesium: 1.8 mg/dL (ref 1.7–2.4)

## 2023-05-11 ENCOUNTER — Encounter (INDEPENDENT_AMBULATORY_CARE_PROVIDER_SITE_OTHER): Payer: Self-pay

## 2023-05-18 ENCOUNTER — Other Ambulatory Visit (HOSPITAL_COMMUNITY): Payer: Medicare HMO

## 2023-05-23 ENCOUNTER — Telehealth: Payer: Self-pay

## 2023-05-23 NOTE — Telephone Encounter (Signed)
-----   Message from Wittmann sent at 05/17/2023 10:06 AM EST ----- Labs show just slight decrease in renal function, can she change her lasix to 40mg  alternating days with 20mg   Dominga Ferry MD

## 2023-05-23 NOTE — Telephone Encounter (Signed)
 After multiple attempts to reach patient, letter was mailed.

## 2023-06-23 ENCOUNTER — Telehealth: Payer: Self-pay

## 2023-06-23 MED ORDER — FUROSEMIDE 40 MG PO TABS
ORAL_TABLET | ORAL | 3 refills | Status: DC
Start: 2023-06-23 — End: 2023-07-18

## 2023-06-23 NOTE — Telephone Encounter (Signed)
 The patient has been notified of the result and verbalized understanding.  All questions (if any) were answered. Roseanne Reno, [facility] 06/23/2023 11:42 AM

## 2023-06-23 NOTE — Telephone Encounter (Signed)
 Antoine Poche, MD 05/17/2023 10:06 AM EST     Labs show just slight decrease in renal function, can she change her lasix to 40mg  alternating days with 20mg    Dominga Ferry MD

## 2023-06-25 DIAGNOSIS — M25561 Pain in right knee: Secondary | ICD-10-CM | POA: Diagnosis not present

## 2023-06-25 DIAGNOSIS — M545 Low back pain, unspecified: Secondary | ICD-10-CM | POA: Diagnosis not present

## 2023-06-25 DIAGNOSIS — M25562 Pain in left knee: Secondary | ICD-10-CM | POA: Diagnosis not present

## 2023-06-25 DIAGNOSIS — M160 Bilateral primary osteoarthritis of hip: Secondary | ICD-10-CM | POA: Diagnosis not present

## 2023-07-04 DIAGNOSIS — M25562 Pain in left knee: Secondary | ICD-10-CM | POA: Diagnosis not present

## 2023-07-04 DIAGNOSIS — M25561 Pain in right knee: Secondary | ICD-10-CM | POA: Diagnosis not present

## 2023-07-05 ENCOUNTER — Telehealth: Payer: Self-pay | Admitting: *Deleted

## 2023-07-05 ENCOUNTER — Telehealth: Payer: Self-pay | Admitting: Cardiology

## 2023-07-05 NOTE — Telephone Encounter (Signed)
   Pre-operative Risk Assessment    Patient Name: Terri Miranda  DOB: February 03, 1946 MRN: [id number]      Request for Surgical Clearance    Procedure:   LT Total Hip Arthroplasty   Date of Surgery:  Clearance TBD                                 Surgeon:  Dr. Pryor Browning  Surgeon's Group or Practice Name:  [facility] Phone number:  [phone] Fax number:  [phone]   Type of Clearance Requested:   - Medical    Type of Anesthesia:  Spinal   Additional requests/questions:    Kee Pastel   07/05/2023, 10:23 AM

## 2023-07-05 NOTE — Telephone Encounter (Signed)
 Spoke with patient and scheduled her for a preop telehealth visit on 07/18/23. Will route back to requesting surgeons office to make them aware.

## 2023-07-05 NOTE — Telephone Encounter (Signed)
   Name: Terri Miranda  DOB: [birth year]  MRN: [id number]  Primary Cardiologist: Armida Lander, MD Last OV: 04/28/23   Preoperative team, please contact this patient and set up a phone call appointment for further preoperative risk assessment. Please obtain consent and complete medication review. Thank you for your help.  I confirm that guidance regarding antiplatelet and oral anticoagulation therapy has been completed and, if necessary, noted below. None requested.   I also confirmed the patient resides in the state of [place] . As per [facility] Medical Board telemedicine laws, the patient must reside in the state in which the provider is licensed.  Luis Sami D Raif Chachere, NP 07/05/2023, 10:40 AM [facility]

## 2023-07-05 NOTE — Telephone Encounter (Signed)
  Patient Consent for Virtual Visit        Terri Miranda has provided verbal consent on 07/05/2023 for a virtual visit (video or telephone).   CONSENT FOR VIRTUAL VISIT FOR:  Terri Miranda  By participating in this virtual visit I agree to the following:  I hereby voluntarily request, consent and authorize [facility] and its employed or contracted physicians, physician assistants, nurse practitioners or other licensed health care professionals (the Practitioner), to provide me with telemedicine health care services (the "Services") as deemed necessary by the treating Practitioner. I acknowledge and consent to receive the Services by the Practitioner via telemedicine. I understand that the telemedicine visit will involve communicating with the Practitioner through live audiovisual communication technology and the disclosure of certain medical information by electronic transmission. I acknowledge that I have been given the opportunity to request an in-person assessment or other available alternative prior to the telemedicine visit and am voluntarily participating in the telemedicine visit.  I understand that I have the right to withhold or withdraw my consent to the use of telemedicine in the course of my care at any time, without affecting my right to future care or treatment, and that the Practitioner or I may terminate the telemedicine visit at any time. I understand that I have the right to inspect all information obtained and/or recorded in the course of the telemedicine visit and may receive copies of available information for a reasonable fee.  I understand that some of the potential risks of receiving the Services via telemedicine include:  Delay or interruption in medical evaluation due to technological equipment failure or disruption; Information transmitted may not be sufficient (e.g. poor resolution of images) to allow for appropriate medical decision making by the  Practitioner; and/or  In rare instances, security protocols could fail, causing a breach of personal health information.  Furthermore, I acknowledge that it is my responsibility to provide information about my medical history, conditions and care that is complete and accurate to the best of my ability. I acknowledge that Practitioner's advice, recommendations, and/or decision may be based on factors not within their control, such as incomplete or inaccurate data provided by me or distortions of diagnostic images or specimens that may result from electronic transmissions. I understand that the practice of medicine is not an exact science and that Practitioner makes no warranties or guarantees regarding treatment outcomes. I acknowledge that a copy of this consent can be made available to me via my patient portal [facility] MyChart), or I can request a printed copy by calling the office of [facility].    I understand that my insurance will be billed for this visit.   I have read or had this consent read to me. I understand the contents of this consent, which adequately explains the benefits and risks of the Services being provided via telemedicine.  I have been provided ample opportunity to ask questions regarding this consent and the Services and have had my questions answered to my satisfaction. I give my informed consent for the services to be provided through the use of telemedicine in my medical care

## 2023-07-18 ENCOUNTER — Encounter: Payer: Self-pay | Admitting: Nurse Practitioner

## 2023-07-18 ENCOUNTER — Ambulatory Visit: Attending: Cardiovascular Disease | Admitting: Nurse Practitioner

## 2023-07-18 ENCOUNTER — Telehealth

## 2023-07-18 DIAGNOSIS — Z01810 Encounter for preprocedural cardiovascular examination: Secondary | ICD-10-CM | POA: Diagnosis not present

## 2023-07-18 DIAGNOSIS — E1169 Type 2 diabetes mellitus with other specified complication: Secondary | ICD-10-CM | POA: Diagnosis not present

## 2023-07-18 DIAGNOSIS — R350 Frequency of micturition: Secondary | ICD-10-CM | POA: Diagnosis not present

## 2023-07-18 NOTE — Progress Notes (Signed)
 Virtual Visit via Telephone Note   Because of Terri Miranda co-morbid illnesses, she is at least at moderate risk for complications without adequate follow up.  This format is felt to be most appropriate for this patient at this time.  Due to technical limitations with video connection Web designer), today's appointment will be conducted as an audio only telehealth visit, and [name] verbally agreed to proceed in this manner.   All issues noted in this document were discussed and addressed.  No physical exam could be performed with this format.  Evaluation Performed:  Preoperative cardiovascular risk assessment _____________   Date:  07/18/2023   Patient ID:  Terri Miranda, DOB 07-23-1945, MRN [id number] Patient Location:  Home Provider location:   Office  Primary Care Provider:  Arva Lathe, MD Primary Cardiologist:  Armida Lander, MD  Chief Complaint / Patient Profile   78 y.o. y/o female with a h/o HTN, leg edema, mild diastolic dysfunction who is pending left total hip arthroplasty with Dr. Pryor Browning on date TBD and presents today for telephonic preoperative cardiovascular risk assessment.  History of Present Illness    Terri Miranda is a 78 y.o. female who presents via audio/video conferencing for a telehealth visit today.  Pt was last seen in cardiology clinic on 04/28/2023 by Dr. Amanda Jungling.  At that time [name] was doing well.  The patient is now pending procedure as outlined above. Since her last visit, she denies chest pain, shortness of breath, lower extremity edema, fatigue, palpitations, melena, hematuria, hemoptysis, diaphoresis, weakness, presyncope, syncope, orthopnea, and PND. She remains active with house work and is able to achieve > 4 METS activity without concerning cardiac symptoms.   Past Medical History    Past Medical History:  Diagnosis Date   Diabetes mellitus without complication (HCC)    Essential hypertension     Mitral valve prolapse    Mixed hyperlipidemia    Palpitations    PONV (postoperative nausea and vomiting)    Pre-diabetes    Past Surgical History:  Procedure Laterality Date   BREAST EXCISIONAL BIOPSY Right    COLONOSCOPY N/A 11/14/2018   Procedure: COLONOSCOPY;  Surgeon: Ruby Corporal, MD;  [facility];  Service: Endoscopy;  Laterality: N/A;  1030   COLONOSCOPY WITH PROPOFOL  N/A 12/25/2019   Procedure: COLONOSCOPY WITH PROPOFOL ;  Surgeon: Urban Garden, MD;  Location: [facility];  Service: Gastroenterology;  Laterality: N/A;  1015   COLONOSCOPY WITH PROPOFOL  N/A 04/26/2023   Procedure: COLONOSCOPY WITH PROPOFOL ;  Surgeon: Urban Garden, MD;  [facility]: [facility];  Service: Gastroenterology;  Laterality: N/A;  1:45PM;ASA 1-2   HEMOSTASIS CLIP PLACEMENT  04/26/2023   Procedure: HEMOSTASIS CLIP PLACEMENT;  Surgeon: Urban Garden, MD;  [facility];  Service: Gastroenterology;;   POLYPECTOMY  11/14/2018   Procedure: POLYPECTOMY;  Surgeon: Ruby Corporal, MD;  [facility];  Service: Endoscopy;;  colon   POLYPECTOMY  12/25/2019   Procedure: POLYPECTOMY;  Surgeon: Urban Garden, MD;  [facility];  Service: Gastroenterology;;   POLYPECTOMY  04/26/2023   Procedure: POLYPECTOMY;  Surgeon: Urban Garden, MD;  [facility];  Service: Gastroenterology;;   REPLACEMENT TOTAL KNEE Right    SUBMUCOSAL LIFTING INJECTION  04/26/2023   Procedure: SUBMUCOSAL LIFTING INJECTION;  Surgeon: Urban Garden, MD;  [facility]: [facility];  Service: Gastroenterology;;    Allergies  Allergies  Allergen Reactions   Shellfish  Allergy Anaphylaxis and Hives   Lovastatin     myalgias    Home Medications    Prior to Admission medications   Medication Sig Start Date End Date Taking? Authorizing Provider  acetaminophen (TYLENOL) 500 MG tablet Take 500 mg by mouth every 6 (six)  hours as needed for moderate pain (pain score 4-6) or headache.    [provider]  allopurinol (ZYLOPRIM) 100 MG tablet Take 100 mg by mouth daily. 07/05/19   [provider]  aspirin EC 81 MG tablet Take 1 tablet (81 mg total) by mouth daily. 11/15/18   Rehman, Mathews Solomons, MD  Carboxymethylcellul-Glycerin (LUBRICATING EYE DROPS OP) Place 1 drop into both eyes daily as needed (dry eyes).     [provider]  carvedilol (COREG) 6.25 MG tablet Take 6.25 mg by mouth 2 (two) times daily with a meal. 08/16/21   [provider]  cholecalciferol (VITAMIN D3) 25 MCG (1000 UT) tablet Take 1,000 Units by mouth daily.    [provider]  diclofenac sodium (VOLTAREN) 1 % GEL Apply 2 g topically 2 (two) times daily as needed (knee pain).  01/10/18   [provider]  enalapril (VASOTEC) 10 MG tablet Take 10 mg by mouth daily.     [provider]  furosemide  (LASIX ) 40 MG tablet Take 40 mg daily alternating days with 20 mg (half tablet) 06/23/23   Branch, Joyceann No, MD  loratadine (CLARITIN) 10 MG tablet Take 10 mg by mouth daily as needed for allergies.    [provider]  metFORMIN (GLUCOPHAGE-XR) 500 MG 24 hr tablet Take 500 mg by mouth at bedtime.  05/18/18   [provider]  oxymetazoline (AFRIN) 0.05 % nasal spray Place 2 sprays into the nose daily as needed for congestion.    [provider]  pravastatin (PRAVACHOL) 20 MG tablet Take 20 mg by mouth daily.    [provider]    Physical Exam    Vital Signs:  TAYLEE YEW does not have vital signs available for review today.  Given telephonic nature of communication, physical exam is limited. AAOx3. NAD. Normal affect.  Speech and respirations are unlabored.  Accessory Clinical Findings    None  Assessment & Plan    1.  Preoperative Cardiovascular Risk Assessment: According to the Revised Cardiac Risk Index (RCRI), her Perioperative Risk of Major Cardiac  Event is (%): 0.9. Her Functional Capacity in METs is: 6.05 according to the Duke Activity Status Index (DASI). The patient is doing well from a cardiac perspective. Therefore, based on ACC/AHA guidelines, the patient would be at acceptable risk for the planned procedure without further cardiovascular testing.   The patient was advised that if she develops new symptoms prior to surgery to contact our office to arrange for a follow-up visit, and she verbalized understanding.  No request to hold cardiac medications.   A copy of this note will be routed to requesting surgeon.  Time:   Today, I have spent 10 minutes with the patient with telehealth technology discussing medical history, symptoms, and management plan.    Gerldine Koch, NP-C  07/18/2023, 9:20 AM [address] [place], [place] [address] Office [phone] Fax [phone]

## 2023-07-27 DIAGNOSIS — M25552 Pain in left hip: Secondary | ICD-10-CM | POA: Diagnosis not present

## 2023-07-27 NOTE — Progress Notes (Signed)
 Surgery orders requested via Epic inbox.

## 2023-07-28 DIAGNOSIS — R269 Unspecified abnormalities of gait and mobility: Secondary | ICD-10-CM | POA: Diagnosis not present

## 2023-07-28 DIAGNOSIS — M1612 Unilateral primary osteoarthritis, left hip: Secondary | ICD-10-CM | POA: Diagnosis not present

## 2023-07-28 DIAGNOSIS — M6281 Muscle weakness (generalized): Secondary | ICD-10-CM | POA: Diagnosis not present

## 2023-07-28 DIAGNOSIS — M25552 Pain in left hip: Secondary | ICD-10-CM | POA: Diagnosis not present

## 2023-07-31 NOTE — Patient Instructions (Signed)
 SURGICAL WAITING ROOM VISITATION  Patients having surgery or a procedure may have no more than 2 support people in the waiting area - these visitors may rotate.    Children under the age of 35 must have an adult with them who is not the patient.  Due to an increase in RSV and influenza rates and associated hospitalizations, children ages 22 and under may not visit patients in [facility] hospitals.  Visitors with respiratory illnesses are discouraged from visiting and should remain at home.  If the patient needs to stay at the hospital during part of their recovery, the visitor guidelines for inpatient rooms apply. Pre-op nurse will coordinate an appropriate time for 1 support person to accompany patient in pre-op.  This support person may not rotate.    Please refer to the [facility] website for the visitor guidelines for Inpatients (after your surgery is over and you are in a regular room).       Your procedure is scheduled on: 08/14/23   Report to [facility] Main Entrance    Report to admitting at : 10:45 AM   Call this number if you have problems the morning of surgery [phone]   Do not eat food :After Midnight.   After Midnight you may have the following liquids until : 10:15 AM DAY OF SURGERY  Water  Non-Citrus Juices (without pulp, NO RED-Apple, White grape, White cranberry) Black Coffee (NO MILK/CREAM OR CREAMERS, sugar ok)  Clear Tea (NO MILK/CREAM OR CREAMERS, sugar ok) regular and decaf                             Plain Jell-O (NO RED)                                           Fruit ices (not with fruit pulp, NO RED)                                     Popsicles (NO RED)                                                               Sports drinks like Gatorade (NO RED)   The day of surgery:  Drink ONE (1) Pre-Surgery Clear G2 at : 10:15 AM the morning of surgery. Drink in one sitting. Do not sip.  This drink was given to you during your hospital  pre-op  appointment visit. Nothing else to drink after completing the  Pre-Surgery Clear Ensure or G2.          If you have questions, please contact your surgeon's office.  FOLLOW ANY ADDITIONAL PRE OP INSTRUCTIONS YOU RECEIVED FROM YOUR SURGEON'S OFFICE!!!   Oral Hygiene is also important to reduce your risk of infection.                                    Remember - BRUSH YOUR TEETH THE MORNING OF SURGERY WITH YOUR REGULAR TOOTHPASTE  DENTURES WILL BE REMOVED PRIOR TO SURGERY PLEASE DO NOT APPLY "Poly grip" OR ADHESIVES!!!   Do NOT smoke after Midnight   Stop all vitamins and herbal supplements 7 days before surgery.   Take these medicines the morning of surgery with A SIP OF WATER :cetirizine,loratadine,carvedilol.Tylenol,allopurinol as needed.   DO NOT TAKE ANY ORAL DIABETIC MEDICATIONS DAY OF YOUR SURGERY                              You may not have any metal on your body including hair pins, jewelry, and body piercing             Do not wear make-up, lotions, powders, perfumes/cologne, or deodorant  Do not wear nail polish including gel and S&S, artificial/acrylic nails, or any other type of covering on natural nails including finger and toenails. If you have artificial nails, gel coating, etc. that needs to be removed by a nail salon please have this removed prior to surgery or surgery may need to be canceled/ delayed if the surgeon/ anesthesia feels like they are unable to be safely monitored.   Do not shave  48 hours prior to surgery.    Do not bring valuables to the hospital.  IS NOT             RESPONSIBLE   FOR VALUABLES.   Contacts, glasses, dentures or bridgework may not be worn into surgery.   Bring small overnight bag day of surgery.   DO NOT BRING YOUR HOME MEDICATIONS TO THE HOSPITAL. PHARMACY WILL DISPENSE MEDICATIONS LISTED ON YOUR MEDICATION LIST TO YOU DURING YOUR ADMISSION IN THE HOSPITAL!    Patients discharged on the day of surgery will not be  allowed to drive home.  Someone NEEDS to stay with you for the first 24 hours after anesthesia.   Special Instructions: Bring a copy of your healthcare power of attorney and living will documents the day of surgery if you haven't scanned them before.              Please read over the following fact sheets you were given: IF YOU HAVE QUESTIONS ABOUT YOUR PRE-OP INSTRUCTIONS PLEASE CALL [phone]   If you received a COVID test during your pre-op visit  it is requested that you wear a mask when out in public, stay away from anyone that may not be feeling well and notify your surgeon if you develop symptoms. If you test positive for Covid or have been in contact with anyone that has tested positive in the last 10 days please notify you surgeon.      Pre-operative 5 CHG Bath Instructions   You can play a key role in reducing the risk of infection after surgery. Your skin needs to be as free of germs as possible. You can reduce the number of germs on your skin by washing with CHG (chlorhexidine gluconate) soap before surgery. CHG is an antiseptic soap that kills germs and continues to kill germs even after washing.   DO NOT use if you have an allergy to chlorhexidine/CHG or antibacterial soaps. If your skin becomes reddened or irritated, stop using the CHG and notify one of our RNs at [phone].   Please shower with the CHG soap starting 4 days before surgery using the following schedule:     Please keep in mind the following:  DO NOT shave, including legs and underarms, starting the day of your first  shower.   You may shave your face at any point before/day of surgery.  Place clean sheets on your bed the day you start using CHG soap. Use a clean washcloth (not used since being washed) for each shower. DO NOT sleep with pets once you start using the CHG.   CHG Shower Instructions:  If you choose to wash your hair and private area, wash first with your normal shampoo/soap.  After you use  shampoo/soap, rinse your hair and body thoroughly to remove shampoo/soap residue.  Turn the water  OFF and apply about 3 tablespoons (45 ml) of CHG soap to a CLEAN washcloth.  Apply CHG soap ONLY FROM YOUR NECK DOWN TO YOUR TOES (washing for 3-5 minutes)  DO NOT use CHG soap on face, private areas, open wounds, or sores.  Pay special attention to the area where your surgery is being performed.  If you are having back surgery, having someone wash your back for you may be helpful. Wait 2 minutes after CHG soap is applied, then you may rinse off the CHG soap.  Pat dry with a clean towel  Put on clean clothes/pajamas   If you choose to wear lotion, please use ONLY the CHG-compatible lotions on the back of this paper.     Additional instructions for the day of surgery: DO NOT APPLY any lotions, deodorants, cologne, or perfumes.   Put on clean/comfortable clothes.  Brush your teeth.  Ask your nurse before applying any prescription medications to the skin.   CHG Compatible Lotions   Aveeno Moisturizing lotion  Cetaphil Moisturizing Cream  Cetaphil Moisturizing Lotion  Clairol Herbal Essence Moisturizing Lotion, Dry Skin  Clairol Herbal Essence Moisturizing Lotion, Extra Dry Skin  Clairol Herbal Essence Moisturizing Lotion, Normal Skin  Curel Age Defying Therapeutic Moisturizing Lotion with Alpha Hydroxy  Curel Extreme Care Body Lotion  Curel Soothing Hands Moisturizing Hand Lotion  Curel Therapeutic Moisturizing Cream, Fragrance-Free  Curel Therapeutic Moisturizing Lotion, Fragrance-Free  Curel Therapeutic Moisturizing Lotion, Original Formula  Eucerin Daily Replenishing Lotion  Eucerin Dry Skin Therapy Plus Alpha Hydroxy Crme  Eucerin Dry Skin Therapy Plus Alpha Hydroxy Lotion  Eucerin Original Crme  Eucerin Original Lotion  Eucerin Plus Crme Eucerin Plus Lotion  Eucerin TriLipid Replenishing Lotion  Keri Anti-Bacterial Hand Lotion  Keri Deep Conditioning Original Lotion Dry Skin  Formula Softly Scented  Keri Deep Conditioning Original Lotion, Fragrance Free Sensitive Skin Formula  Keri Lotion Fast Absorbing Fragrance Free Sensitive Skin Formula  Keri Lotion Fast Absorbing Softly Scented Dry Skin Formula  Keri Original Lotion  Keri Skin Renewal Lotion Keri Silky Smooth Lotion  Keri Silky Smooth Sensitive Skin Lotion  Nivea Body Creamy Conditioning Oil  Nivea Body Extra Enriched Lotion  Nivea Body Original Lotion  Nivea Body Sheer Moisturizing Lotion Nivea Crme  Nivea Skin Firming Lotion  NutraDerm 30 Skin Lotion  NutraDerm Skin Lotion  NutraDerm Therapeutic Skin Cream  NutraDerm Therapeutic Skin Lotion  ProShield Protective Hand Cream  Provon moisturizing lotion   Incentive Spirometer  An incentive spirometer is a tool that can help keep your lungs clear and active. This tool measures how well you are filling your lungs with each breath. Taking long deep breaths may help reverse or decrease the chance of developing breathing (pulmonary) problems (especially infection) following: A long period of time when you are unable to move or be active. BEFORE THE PROCEDURE  If the spirometer includes an indicator to show your best effort, your nurse or respiratory therapist will set  it to a desired goal. If possible, sit up straight or lean slightly forward. Try not to slouch. Hold the incentive spirometer in an upright position. INSTRUCTIONS FOR USE  Sit on the edge of your bed if possible, or sit up as far as you can in bed or on a chair. Hold the incentive spirometer in an upright position. Breathe out normally. Place the mouthpiece in your mouth and seal your lips tightly around it. Breathe in slowly and as deeply as possible, raising the piston or the ball toward the top of the column. Hold your breath for 3-5 seconds or for as long as possible. Allow the piston or ball to fall to the bottom of the column. Remove the mouthpiece from your mouth and breathe out  normally. Rest for a few seconds and repeat Steps 1 through 7 at least 10 times every 1-2 hours when you are awake. Take your time and take a few normal breaths between deep breaths. The spirometer may include an indicator to show your best effort. Use the indicator as a goal to work toward during each repetition. After each set of 10 deep breaths, practice coughing to be sure your lungs are clear. If you have an incision (the cut made at the time of surgery), support your incision when coughing by placing a pillow or rolled up towels firmly against it. Once you are able to get out of bed, walk around indoors and cough well. You may stop using the incentive spirometer when instructed by your caregiver.  RISKS AND COMPLICATIONS Take your time so you do not get dizzy or light-headed. If you are in pain, you may need to take or ask for pain medication before doing incentive spirometry. It is harder to take a deep breath if you are having pain. AFTER USE Rest and breathe slowly and easily. It can be helpful to keep track of a log of your progress. Your caregiver can provide you with a simple table to help with this. If you are using the spirometer at home, follow these instructions: SEEK MEDICAL CARE IF:  You are having difficultly using the spirometer. You have trouble using the spirometer as often as instructed. Your pain medication is not giving enough relief while using the spirometer. You develop fever of 100.5 F (38.1 C) or higher. SEEK IMMEDIATE MEDICAL CARE IF:  You cough up bloody sputum that had not been present before. You develop fever of 102 F (38.9 C) or greater. You develop worsening pain at or near the incision site. MAKE SURE YOU:  Understand these instructions. Will watch your condition. Will get help right away if you are not doing well or get worse. Document Released: 07/11/2006 Document Revised: 05/23/2011 Document Reviewed: 09/11/2006 [facility] Patient Information  2014 [place], [place].   ________________________________________________________________________

## 2023-08-02 ENCOUNTER — Encounter (HOSPITAL_COMMUNITY): Payer: Self-pay

## 2023-08-02 ENCOUNTER — Other Ambulatory Visit: Payer: Self-pay

## 2023-08-02 ENCOUNTER — Ambulatory Visit: Payer: Self-pay | Admitting: Emergency Medicine

## 2023-08-02 ENCOUNTER — Encounter (HOSPITAL_COMMUNITY)
Admission: RE | Admit: 2023-08-02 | Discharge: 2023-08-02 | Disposition: A | Discharge status: Home / Self Care | Source: Ambulatory Visit | Attending: Orthopedic Surgery | Admitting: Orthopedic Surgery

## 2023-08-02 VITALS — BP 148/69 | HR 69 | Temp 98.7°F | Ht 67.5 in | Wt 233.0 lb

## 2023-08-02 DIAGNOSIS — Z01812 Encounter for preprocedural laboratory examination: Secondary | ICD-10-CM | POA: Diagnosis not present

## 2023-08-02 DIAGNOSIS — M1612 Unilateral primary osteoarthritis, left hip: Secondary | ICD-10-CM | POA: Diagnosis not present

## 2023-08-02 DIAGNOSIS — I129 Hypertensive chronic kidney disease with stage 1 through stage 4 chronic kidney disease, or unspecified chronic kidney disease: Secondary | ICD-10-CM | POA: Insufficient documentation

## 2023-08-02 DIAGNOSIS — E119 Type 2 diabetes mellitus without complications: Secondary | ICD-10-CM

## 2023-08-02 DIAGNOSIS — G8929 Other chronic pain: Secondary | ICD-10-CM

## 2023-08-02 DIAGNOSIS — Z7984 Long term (current) use of oral hypoglycemic drugs: Secondary | ICD-10-CM | POA: Diagnosis not present

## 2023-08-02 DIAGNOSIS — N189 Chronic kidney disease, unspecified: Secondary | ICD-10-CM | POA: Diagnosis not present

## 2023-08-02 DIAGNOSIS — I10 Essential (primary) hypertension: Secondary | ICD-10-CM

## 2023-08-02 DIAGNOSIS — Z01818 Encounter for other preprocedural examination: Secondary | ICD-10-CM

## 2023-08-02 DIAGNOSIS — E1122 Type 2 diabetes mellitus with diabetic chronic kidney disease: Secondary | ICD-10-CM | POA: Diagnosis not present

## 2023-08-02 HISTORY — DX: Unspecified osteoarthritis, unspecified site: M19.90

## 2023-08-02 HISTORY — DX: Chronic kidney disease, unspecified: N18.9

## 2023-08-02 LAB — COMPREHENSIVE METABOLIC PANEL WITH GFR
ALT: 11 U/L (ref 0–44)
AST: 14 U/L — ABNORMAL LOW (ref 15–41)
Albumin: 3.8 g/dL (ref 3.5–5.0)
Alkaline Phosphatase: 54 U/L (ref 38–126)
Anion gap: 8 (ref 5–15)
BUN: 29 mg/dL — ABNORMAL HIGH (ref 8–23)
CO2: 26 mmol/L (ref 22–32)
Calcium: 9.2 mg/dL (ref 8.9–10.3)
Chloride: 103 mmol/L (ref 98–111)
Creatinine, Ser: 1.07 mg/dL — ABNORMAL HIGH (ref 0.44–1.00)
GFR, Estimated: 54 mL/min — ABNORMAL LOW (ref >60)
Glucose, Bld: 97 mg/dL (ref 70–99)
Potassium: 4.3 mmol/L (ref 3.5–5.1)
Sodium: 137 mmol/L (ref 135–145)
Total Bilirubin: 0.6 mg/dL (ref 0.0–1.2)
Total Protein: 6.8 g/dL (ref 6.5–8.1)

## 2023-08-02 LAB — HEMOGLOBIN A1C
Hgb A1c MFr Bld: 6 % — ABNORMAL HIGH (ref 4.8–5.6)
Mean Plasma Glucose: 125.5 mg/dL

## 2023-08-02 LAB — CBC WITH DIFFERENTIAL/PLATELET
Abs Immature Granulocytes: 0.02 10*3/uL (ref 0.00–0.07)
Basophils Absolute: 0 10*3/uL (ref 0.0–0.1)
Basophils Relative: 0 %
Eosinophils Absolute: 0 10*3/uL (ref 0.0–0.5)
Eosinophils Relative: 1 %
HCT: 38.2 % (ref 36.0–46.0)
Hemoglobin: 11.7 g/dL — ABNORMAL LOW (ref 12.0–15.0)
Immature Granulocytes: 1 %
Lymphocytes Relative: 40 %
Lymphs Abs: 1.7 10*3/uL (ref 0.7–4.0)
MCH: 28.5 pg (ref 26.0–34.0)
MCHC: 30.6 g/dL (ref 30.0–36.0)
MCV: 93.2 fL (ref 80.0–100.0)
Monocytes Absolute: 0.4 10*3/uL (ref 0.1–1.0)
Monocytes Relative: 11 %
Neutro Abs: 2 10*3/uL (ref 1.7–7.7)
Neutrophils Relative %: 47 %
Platelets: 144 10*3/uL — ABNORMAL LOW (ref 150–400)
RBC: 4.1 MIL/uL (ref 3.87–5.11)
RDW: 14.1 % (ref 11.5–15.5)
WBC: 4.1 10*3/uL (ref 4.0–10.5)
nRBC: 0 % (ref 0.0–0.2)

## 2023-08-02 LAB — TYPE AND SCREEN
ABO/RH(D): AB NEG
Antibody Screen: NEGATIVE

## 2023-08-02 LAB — GLUCOSE, CAPILLARY: Glucose-Capillary: 96 mg/dL (ref 70–99)

## 2023-08-02 NOTE — H&P (Signed)
 TOTAL HIP ADMISSION H&P  Patient is admitted for left total hip arthroplasty.  Subjective:  Chief Complaint: left hip pain  HPI: Terri Miranda, 78 y.o. female, has a history of pain and functional disability in the left hip(s) due to arthritis and patient has failed non-surgical conservative treatments for greater than 12 weeks to include NSAID's and/or analgesics, use of assistive devices, and activity modification.  Onset of symptoms was gradual starting 3 years ago with gradually worsening course since that time.The patient noted no past surgery on the left hip(s).  Patient currently rates pain in the left hip at 6 out of 10 with activity. Patient has night pain, worsening of pain with activity and weight bearing, pain that interfers with activities of daily living, and pain with passive range of motion. Patient has evidence of periarticular osteophytes and joint space narrowing by imaging studies. This condition presents safety issues increasing the risk of falls. T  There is no current active infection.  Patient Active Problem List   Diagnosis Date Noted   History of colonic polyps 04/26/2023   Special screening for malignant neoplasms, colon 05/03/2018   Ankle pain 10/16/2015   UTI 08/08/2008   Backache 05/19/2008   DIZZINESS 05/19/2008   DIABETES MELLITUS, TYPE II 02/19/2008   HYPERLIPIDEMIA 02/19/2008   Essential hypertension 02/19/2008   Allergic rhinitis 02/19/2008   Past Medical History:  Diagnosis Date   Diabetes mellitus without complication (HCC)    Essential hypertension    Mitral valve prolapse    Mixed hyperlipidemia    Palpitations    PONV (postoperative nausea and vomiting)    Pre-diabetes     Past Surgical History:  Procedure Laterality Date   BREAST EXCISIONAL BIOPSY Right    COLONOSCOPY N/A 11/14/2018   Procedure: COLONOSCOPY;  Surgeon: Ruby Corporal, MD;  [facility];  Service: Endoscopy;  Laterality: N/A;  1030   COLONOSCOPY WITH  PROPOFOL  N/A 12/25/2019   Procedure: COLONOSCOPY WITH PROPOFOL ;  Surgeon: Urban Garden, MD;  [facility];  Service: Gastroenterology;  Laterality: N/A;  1015   COLONOSCOPY WITH PROPOFOL  N/A 04/26/2023   Procedure: COLONOSCOPY WITH PROPOFOL ;  Surgeon: Urban Garden, MD;  [facility];  Service: Gastroenterology;  Laterality: N/A;  1:45PM;ASA 1-2   HEMOSTASIS CLIP PLACEMENT  04/26/2023   Procedure: HEMOSTASIS CLIP PLACEMENT;  Surgeon: Urban Garden, MD;  [facility];  Service: Gastroenterology;;   POLYPECTOMY  11/14/2018   Procedure: POLYPECTOMY;  Surgeon: Ruby Corporal, MD;  [facility];  Service: Endoscopy;;  colon   POLYPECTOMY  12/25/2019   Procedure: POLYPECTOMY;  Surgeon: Urban Garden, MD;  [facility];  Service: Gastroenterology;;   POLYPECTOMY  04/26/2023   Procedure: POLYPECTOMY;  Surgeon: Urban Garden, MD;  [facility];  Service: Gastroenterology;;   REPLACEMENT TOTAL KNEE Right    SUBMUCOSAL LIFTING INJECTION  04/26/2023   Procedure: SUBMUCOSAL LIFTING INJECTION;  Surgeon: Urban Garden, MD;  [facility];  Service: Gastroenterology;;    Current Outpatient Medications  Medication Sig Dispense Refill Last Dose/Taking   acetaminophen (TYLENOL) 500 MG tablet Take 500 mg by mouth every 6 (six) hours as needed for moderate pain (pain score 4-6) or headache.      allopurinol (ZYLOPRIM) 100 MG tablet Take 100 mg by mouth daily as needed (gout symptoms).      amoxicillin  (AMOXIL ) 500 MG capsule Take 2,000 mg by mouth See admin instructions.  Take 4 capsules (2000 mg) by mouth 1 hour prior to dental appointments      aspirin EC 81 MG tablet Take 1 tablet (81 mg total) by mouth daily.      CALCIUM PO Take 1 tablet by mouth 2 (two) times a week.      Carboxymethylcellul-Glycerin (LUBRICATING EYE DROPS OP) Place 1 drop into both eyes 3 (three)  times daily as needed (dry/irritated eyes.).      carvedilol (COREG) 12.5 MG tablet Take 12.5 mg by mouth 2 (two) times daily with a meal.      cetirizine (ZYRTEC) 10 MG tablet Take 10 mg by mouth daily as needed for allergies.      cholecalciferol (VITAMIN D3) 25 MCG (1000 UT) tablet Take 1,000 Units by mouth daily.      diclofenac sodium (VOLTAREN) 1 % GEL Apply 2 g topically 2 (two) times daily as needed (knee pain).       enalapril (VASOTEC) 20 MG tablet Take 20 mg by mouth at bedtime.      furosemide  (LASIX ) 20 MG tablet Take 20 mg by mouth daily as needed (fluid retention.).      loratadine (CLARITIN) 10 MG tablet Take 10 mg by mouth daily as needed for allergies.      metFORMIN (GLUCOPHAGE-XR) 500 MG 24 hr tablet Take 500 mg by mouth at bedtime.       Potassium Chloride ER 20 MEQ TBCR Take 20 mEq by mouth daily as needed (fluid retention (with lasix )).      pravastatin (PRAVACHOL) 20 MG tablet Take 20 mg by mouth at bedtime.      sodium chloride  (OCEAN) 0.65 % SOLN nasal spray Place 1 spray into both nostrils 3 (three) times daily as needed for congestion.      No current facility-administered medications for this visit.   Allergies  Allergen Reactions   Shellfish Allergy Anaphylaxis and Hives   Lovastatin     myalgias    Social History   Tobacco Use   Smoking status: Never   Smokeless tobacco: Never  Substance Use Topics   Alcohol use: Yes    Comment: Occasional wine    Family History  Problem Relation Age of Onset   Hypertension Mother    CVA Mother    Coronary artery disease Father    Prostate cancer Father    Hypertension Sister    Coronary artery disease Sister    Breast cancer Maternal Aunt      Review of Systems  Musculoskeletal:  Positive for arthralgias.  All other systems reviewed and are negative.   Objective:  Physical Exam Constitutional:      General: She is not in acute distress.    Appearance: Normal appearance. She is not ill-appearing.   HENT:     Head: Normocephalic and atraumatic.     Right Ear: External ear normal.     Left Ear: External ear normal.     Nose: Nose normal.     Mouth/Throat:     Mouth: Mucous membranes are moist.     Pharynx: Oropharynx is clear.  Eyes:     Extraocular Movements: Extraocular movements intact.     Conjunctiva/sclera: Conjunctivae normal.  Cardiovascular:     Rate and Rhythm: Normal rate and regular rhythm.     Pulses: Normal pulses.     Heart sounds: Normal heart sounds.  Pulmonary:     Effort: Pulmonary effort is normal.     Breath sounds: Normal breath sounds.  Abdominal:     General: Bowel sounds are normal.     Palpations: Abdomen is soft.     Tenderness: There is no abdominal tenderness.  Musculoskeletal:        General: Tenderness present.     Cervical back: Normal range of motion and neck supple.     Comments: TTP over groin, lateral aspect, greater trochanter.  Mild IT band tenderness.  No significant swelling.  No overlying lesions of area of chief complaint.  Decreased strength and ROM due to elicited pain.  Dorsiflexion and plantarflexion intact.  BLE appear grossly neurovascularly intact.  Gait mildly antalgic.   Skin:    General: Skin is warm and dry.  Neurological:     Mental Status: She is alert and oriented to person, place, and time. Mental status is at baseline.  Psychiatric:        Mood and Affect: Mood normal.        Behavior: Behavior normal.     Vital signs in last 24 hours: @VSRANGES @  Labs:   Estimated body mass index is 35.65 kg/m as calculated from the following:   Height as of 04/28/23: 5' 7.5" (1.715 m).   Weight as of 04/28/23: 104.8 kg.   Imaging Review Plain radiographs demonstrate severe degenerative joint disease of the left hip(s). The bone quality appears to be fair for age and reported activity level.      Assessment/Plan:  End stage arthritis, left hip(s)  The patient history, physical examination, clinical judgement of  the provider and imaging studies are consistent with end stage degenerative joint disease of the left hip(s) and total hip arthroplasty is deemed medically necessary. The treatment options including medical management, injection therapy, arthroscopy and arthroplasty were discussed at length. The risks and benefits of total hip arthroplasty were presented and reviewed. The risks due to aseptic loosening, infection, stiffness, dislocation/subluxation,  thromboembolic complications and other imponderables were discussed.  The patient acknowledged the explanation, agreed to proceed with the plan and consent was signed. Patient is being admitted for inpatient treatment for surgery, pain control, PT, OT, prophylactic antibiotics, VTE prophylaxis, progressive ambulation and ADL's and discharge planning.The patient is planning to be discharged home with outpatient PT.   Anticipated LOS equal to or greater than 2 midnights due to - Age 46 and older with one or more of the following:  - Obesity  - Expected need for hospital services (PT, OT, Nursing) required for safe  discharge  - Anticipated need for postoperative skilled nursing care or inpatient rehab  - Active co-morbidities: diabetes, HTN, HLD, CKD IIIb, mitral valve prolaspe, gout OR   - Unanticipated findings during/Post Surgery: None  - Patient is a high risk of re-admission due to: None

## 2023-08-02 NOTE — Progress Notes (Signed)
 For Anesthesia: PCP - Varadarajan, Rupashree, MD  Cardiologist - Branch, Joyceann No, MD  Clearance: Gerldine Koch, NP  Bowel Prep reminder:N/A  Chest x-ray -  EKG - 04/28/23 Stress Test -  ECHO - 05/09/23 Cardiac Cath -  Pacemaker/ICD device last checked: Pacemaker orders received: Device Rep notified:  Spinal Cord Stimulator:N/A  Sleep Study - N/A CPAP -   Fasting Blood Sugar - 100's Checks Blood Sugar __2___ times a week. Date and result of last Hgb A1c-  Last dose of GLP1 agonist- N/A GLP1 instructions:   Last dose of SGLT-2 inhibitors- N/A SGLT-2 instructions:   Blood Thinner Instructions: Aspirin Instructions:To hold it 5 days before surgery. Last Dose:  Activity level: Can go up a flight of stairs and activities of daily living without stopping and without chest pain and/or shortness of breath    Unable to go up a flight of stairs due to hip pain.    Anesthesia review: Hx: DIA,HTN,Palpitations.  Patient denies shortness of breath, fever, cough and chest pain at PAT appointment   Patient verbalized understanding of instructions that were given to them at the PAT appointment. Patient was also instructed that they will need to review over the PAT instructions again at home before surgery.

## 2023-08-02 NOTE — H&P (View-Only) (Signed)
 TOTAL HIP ADMISSION H&P  Patient is admitted for left total hip arthroplasty.  Subjective:  Chief Complaint: left hip pain  HPI: Terri Miranda, 78 y.o. female, has a history of pain and functional disability in the left hip(s) due to arthritis and patient has failed non-surgical conservative treatments for greater than 12 weeks to include NSAID's and/or analgesics, use of assistive devices, and activity modification.  Onset of symptoms was gradual starting 3 years ago with gradually worsening course since that time.The patient noted no past surgery on the left hip(s).  Patient currently rates pain in the left hip at 6 out of 10 with activity. Patient has night pain, worsening of pain with activity and weight bearing, pain that interfers with activities of daily living, and pain with passive range of motion. Patient has evidence of periarticular osteophytes and joint space narrowing by imaging studies. This condition presents safety issues increasing the risk of falls. T  There is no current active infection.  Patient Active Problem List   Diagnosis Date Noted   History of colonic polyps 04/26/2023   Special screening for malignant neoplasms, colon 05/03/2018   Ankle pain 10/16/2015   UTI 08/08/2008   Backache 05/19/2008   DIZZINESS 05/19/2008   DIABETES MELLITUS, TYPE II 02/19/2008   HYPERLIPIDEMIA 02/19/2008   Essential hypertension 02/19/2008   Allergic rhinitis 02/19/2008   Past Medical History:  Diagnosis Date   Diabetes mellitus without complication (HCC)    Essential hypertension    Mitral valve prolapse    Mixed hyperlipidemia    Palpitations    PONV (postoperative nausea and vomiting)    Pre-diabetes     Past Surgical History:  Procedure Laterality Date   BREAST EXCISIONAL BIOPSY Right    COLONOSCOPY N/A 11/14/2018   Procedure: COLONOSCOPY;  Surgeon: Ruby Corporal, MD;  [facility];  Service: Endoscopy;  Laterality: N/A;  1030   COLONOSCOPY WITH  PROPOFOL  N/A 12/25/2019   Procedure: COLONOSCOPY WITH PROPOFOL ;  Surgeon: Urban Garden, MD;  [facility];  Service: Gastroenterology;  Laterality: N/A;  1015   COLONOSCOPY WITH PROPOFOL  N/A 04/26/2023   Procedure: COLONOSCOPY WITH PROPOFOL ;  Surgeon: Urban Garden, MD;  [facility];  Service: Gastroenterology;  Laterality: N/A;  1:45PM;ASA 1-2   HEMOSTASIS CLIP PLACEMENT  04/26/2023   Procedure: HEMOSTASIS CLIP PLACEMENT;  Surgeon: Urban Garden, MD;  [facility];  Service: Gastroenterology;;   POLYPECTOMY  11/14/2018   Procedure: POLYPECTOMY;  Surgeon: Ruby Corporal, MD;  [facility];  Service: Endoscopy;;  colon   POLYPECTOMY  12/25/2019   Procedure: POLYPECTOMY;  Surgeon: Urban Garden, MD;  [facility];  Service: Gastroenterology;;   POLYPECTOMY  04/26/2023   Procedure: POLYPECTOMY;  Surgeon: Urban Garden, MD;  [facility];  Service: Gastroenterology;;   REPLACEMENT TOTAL KNEE Right    SUBMUCOSAL LIFTING INJECTION  04/26/2023   Procedure: SUBMUCOSAL LIFTING INJECTION;  Surgeon: Urban Garden, MD;  [facility];  Service: Gastroenterology;;    Current Outpatient Medications  Medication Sig Dispense Refill Last Dose/Taking   acetaminophen (TYLENOL) 500 MG tablet Take 500 mg by mouth every 6 (six) hours as needed for moderate pain (pain score 4-6) or headache.      allopurinol (ZYLOPRIM) 100 MG tablet Take 100 mg by mouth daily as needed (gout symptoms).      amoxicillin  (AMOXIL ) 500 MG capsule Take 2,000 mg by mouth See admin instructions.  Take 4 capsules (2000 mg) by mouth 1 hour prior to dental appointments      aspirin EC 81 MG tablet Take 1 tablet (81 mg total) by mouth daily.      CALCIUM PO Take 1 tablet by mouth 2 (two) times a week.      Carboxymethylcellul-Glycerin (LUBRICATING EYE DROPS OP) Place 1 drop into both eyes 3 (three)  times daily as needed (dry/irritated eyes.).      carvedilol (COREG) 12.5 MG tablet Take 12.5 mg by mouth 2 (two) times daily with a meal.      cetirizine (ZYRTEC) 10 MG tablet Take 10 mg by mouth daily as needed for allergies.      cholecalciferol (VITAMIN D3) 25 MCG (1000 UT) tablet Take 1,000 Units by mouth daily.      diclofenac sodium (VOLTAREN) 1 % GEL Apply 2 g topically 2 (two) times daily as needed (knee pain).       enalapril (VASOTEC) 20 MG tablet Take 20 mg by mouth at bedtime.      furosemide  (LASIX ) 20 MG tablet Take 20 mg by mouth daily as needed (fluid retention.).      loratadine (CLARITIN) 10 MG tablet Take 10 mg by mouth daily as needed for allergies.      metFORMIN (GLUCOPHAGE-XR) 500 MG 24 hr tablet Take 500 mg by mouth at bedtime.       Potassium Chloride ER 20 MEQ TBCR Take 20 mEq by mouth daily as needed (fluid retention (with lasix )).      pravastatin (PRAVACHOL) 20 MG tablet Take 20 mg by mouth at bedtime.      sodium chloride  (OCEAN) 0.65 % SOLN nasal spray Place 1 spray into both nostrils 3 (three) times daily as needed for congestion.      No current facility-administered medications for this visit.   Allergies  Allergen Reactions   Shellfish Allergy Anaphylaxis and Hives   Lovastatin     myalgias    Social History   Tobacco Use   Smoking status: Never   Smokeless tobacco: Never  Substance Use Topics   Alcohol use: Yes    Comment: Occasional wine    Family History  Problem Relation Age of Onset   Hypertension Mother    CVA Mother    Coronary artery disease Father    Prostate cancer Father    Hypertension Sister    Coronary artery disease Sister    Breast cancer Maternal Aunt      Review of Systems  Musculoskeletal:  Positive for arthralgias.  All other systems reviewed and are negative.   Objective:  Physical Exam Constitutional:      General: She is not in acute distress.    Appearance: Normal appearance. She is not ill-appearing.   HENT:     Head: Normocephalic and atraumatic.     Right Ear: External ear normal.     Left Ear: External ear normal.     Nose: Nose normal.     Mouth/Throat:     Mouth: Mucous membranes are moist.     Pharynx: Oropharynx is clear.  Eyes:     Extraocular Movements: Extraocular movements intact.     Conjunctiva/sclera: Conjunctivae normal.  Cardiovascular:     Rate and Rhythm: Normal rate and regular rhythm.     Pulses: Normal pulses.     Heart sounds: Normal heart sounds.  Pulmonary:     Effort: Pulmonary effort is normal.     Breath sounds: Normal breath sounds.  Abdominal:     General: Bowel sounds are normal.     Palpations: Abdomen is soft.     Tenderness: There is no abdominal tenderness.  Musculoskeletal:        General: Tenderness present.     Cervical back: Normal range of motion and neck supple.     Comments: TTP over groin, lateral aspect, greater trochanter.  Mild IT band tenderness.  No significant swelling.  No overlying lesions of area of chief complaint.  Decreased strength and ROM due to elicited pain.  Dorsiflexion and plantarflexion intact.  BLE appear grossly neurovascularly intact.  Gait mildly antalgic.   Skin:    General: Skin is warm and dry.  Neurological:     Mental Status: She is alert and oriented to person, place, and time. Mental status is at baseline.  Psychiatric:        Mood and Affect: Mood normal.        Behavior: Behavior normal.     Vital signs in last 24 hours: @VSRANGES @  Labs:   Estimated body mass index is 35.65 kg/m as calculated from the following:   Height as of 04/28/23: 5' 7.5" (1.715 m).   Weight as of 04/28/23: 104.8 kg.   Imaging Review Plain radiographs demonstrate severe degenerative joint disease of the left hip(s). The bone quality appears to be fair for age and reported activity level.      Assessment/Plan:  End stage arthritis, left hip(s)  The patient history, physical examination, clinical judgement of  the provider and imaging studies are consistent with end stage degenerative joint disease of the left hip(s) and total hip arthroplasty is deemed medically necessary. The treatment options including medical management, injection therapy, arthroscopy and arthroplasty were discussed at length. The risks and benefits of total hip arthroplasty were presented and reviewed. The risks due to aseptic loosening, infection, stiffness, dislocation/subluxation,  thromboembolic complications and other imponderables were discussed.  The patient acknowledged the explanation, agreed to proceed with the plan and consent was signed. Patient is being admitted for inpatient treatment for surgery, pain control, PT, OT, prophylactic antibiotics, VTE prophylaxis, progressive ambulation and ADL's and discharge planning.The patient is planning to be discharged home with outpatient PT.   Anticipated LOS equal to or greater than 2 midnights due to - Age 46 and older with one or more of the following:  - Obesity  - Expected need for hospital services (PT, OT, Nursing) required for safe  discharge  - Anticipated need for postoperative skilled nursing care or inpatient rehab  - Active co-morbidities: diabetes, HTN, HLD, CKD IIIb, mitral valve prolaspe, gout OR   - Unanticipated findings during/Post Surgery: None  - Patient is a high risk of re-admission due to: None

## 2023-08-03 ENCOUNTER — Encounter (HOSPITAL_COMMUNITY): Payer: Self-pay

## 2023-08-03 NOTE — Anesthesia Preprocedure Evaluation (Addendum)
 Anesthesia Evaluation  Patient identified by MRN, date of birth, ID band Patient awake    Reviewed: Allergy & Precautions, NPO status , Patient's Chart, lab work & pertinent test results  History of Anesthesia Complications (+) PONV and history of anesthetic complications  Airway Mallampati: II  TM Distance: >3 FB Neck ROM: Full    Dental   Pulmonary neg pulmonary ROS   breath sounds clear to auscultation       Cardiovascular hypertension, Pt. on medications and Pt. on home beta blockers  Rhythm:Regular Rate:Normal     Neuro/Psych negative neurological ROS     GI/Hepatic negative GI ROS, Neg liver ROS,,,  Endo/Other  diabetes, Type 2    Renal/GU CRFRenal disease     Musculoskeletal  (+) Arthritis ,    Abdominal   Peds  Hematology negative hematology ROS (+)   Anesthesia Other Findings   Reproductive/Obstetrics                             Anesthesia Physical Anesthesia Plan  ASA: 2  Anesthesia Plan: Spinal   Post-op Pain Management: Tylenol PO (pre-op)* and Toradol IV (intra-op)*   Induction:   PONV Risk Score and Plan: 3 and Propofol  infusion, Dexamethasone and Ondansetron  Airway Management Planned: Natural Airway and Simple Face Mask  Additional Equipment:   Intra-op Plan:   Post-operative Plan:   Informed Consent: I have reviewed the patients History and Physical, chart, labs and discussed the procedure including the risks, benefits and alternatives for the proposed anesthesia with the patient or authorized representative who has indicated his/her understanding and acceptance.       Plan Discussed with: CRNA  Anesthesia Plan Comments:         Anesthesia Quick Evaluation

## 2023-08-03 NOTE — Care Plan (Signed)
 Ortho Bundle Case Management Note  Patient Details  Name: Terri Miranda MRN: [id number] Date of Birth: 09-27-1945  met with patient in the office for H&P. will discharge to home with family. has DME. HHPT referral to [facility]. OPPT set up with [facility]. discharge instructions discussed and questions answered. Patient and MD in agreement. Choice offered.                     DME Arranged:    DME Agency:     HH Arranged:  PT HH Agency:  [facility]  Additional Comments: Please contact me with any questions of if this plan should need to change.  Cornelia Dieter,  RN,BSN,MHA,CCM  [facility]  [phone] 08/03/2023, 2:27 PM

## 2023-08-03 NOTE — Progress Notes (Signed)
 Case: [id number] Date/Time: 08/14/23 1304   Procedure: ARTHROPLASTY, HIP, TOTAL, ANTERIOR APPROACH (Left: Hip)   Anesthesia type: Spinal   Pre-op diagnosis: OA LEFT HIP   Location: [facility] ROOM 08 / [facility]   Surgeons: Murleen Arms, MD       DISCUSSION: Terri Miranda is a 63 female who presents to PAT prior to surgery above. PMH of HTN, palpitations, DM (A1c 6.0), CKD, arthritis.  Prior anesthesia complications include PONV  Patient followed by Cardiology for hx of palpitations, LE edema. She was advised to undergo repeat echo in 04/2023 which was overall normal. Stress testing in 2022 was low risk. She had a tele visit for cardiac clearance on 5/6 and was cleared for surgery:  " Preoperative Cardiovascular Risk Assessment: According to the Revised Cardiac Risk Index (RCRI), her Perioperative Risk of Major Cardiac Event is (%): 0.9. Her Functional Capacity in METs is: 6.05 according to the Duke Activity Status Index (DASI). The patient is doing well from a cardiac perspective. Therefore, based on ACC/AHA guidelines, the patient would be at acceptable risk for the planned procedure without further cardiovascular testing."    VS: BP (!) 148/69   Pulse 69   Temp 37.1 C (Oral)   Ht 5' 7.5" (1.715 m)   Wt 105.7 kg   SpO2 99%   BMI 35.95 kg/m   PROVIDERS: Arva Lathe, MD Cardiologist - Amanda Jungling Joyceann No, MD   LABS: Labs reviewed: Acceptable for surgery. (all labs ordered are listed, but only abnormal results are displayed)  Labs Reviewed  CBC WITH DIFFERENTIAL/PLATELET - Abnormal; Notable for the following components:      Result Value   Hemoglobin 11.7 (*)    Platelets 144 (*)    All other components within normal limits  COMPREHENSIVE METABOLIC PANEL WITH GFR - Abnormal; Notable for the following components:   BUN 29 (*)    Creatinine, Ser 1.07 (*)    AST 14 (*)    GFR, Estimated 54 (*)    All other components within normal limits  HEMOGLOBIN A1C - Abnormal;  Notable for the following components:   Hgb A1c MFr Bld 6.0 (*)    All other components within normal limits  GLUCOSE, CAPILLARY  TYPE AND SCREEN     EKG 04/28/23:  Normal sinus rhythm, rate 64 Anterolateral infarct , age undetermined  CV:  Echo 05/09/23:  IMPRESSIONS    1. Left ventricular ejection fraction, by estimation, is 65 to 70%. The left ventricle has normal function. The left ventricle has no regional wall motion abnormalities. There is mild left ventricular hypertrophy. Left ventricular diastolic parameters were normal.  2. Right ventricular systolic function is normal. The right ventricular size is normal.  3. The mitral valve is normal in structure. Trivial mitral valve regurgitation.  4. The aortic valve is tricuspid. Aortic valve regurgitation is not visualized.  5. The inferior vena cava is normal in size with greater than 50% respiratory variability, suggesting right atrial pressure of 3 mmHg.  Comparison(s): The left ventricular function is unchanged.  Stress test 05/28/2020:  Narrative & Impression No diagnostic ST segment changes to indicate ischemia. No significant myocardial perfusion defects to indicate scar or ischemia. There is breast attenuation artifact noted. This is a low risk study. Nuclear stress EF: 82%.  Past Medical History:  Diagnosis Date   Arthritis    Chronic kidney disease    stage 1   Diabetes mellitus without complication (HCC)    Essential hypertension    Mitral  valve prolapse    Mixed hyperlipidemia    Palpitations    PONV (postoperative nausea and vomiting)    Pre-diabetes     Past Surgical History:  Procedure Laterality Date   BREAST EXCISIONAL BIOPSY Right    CATARACT EXTRACTION PHACO AND INTRAOCULAR LENS PLACEMENT W/ CORTICOSTEROID Right    COLONOSCOPY N/A 11/14/2018   Procedure: COLONOSCOPY;  Surgeon: Ruby Corporal, MD;  [facility];  Service: Endoscopy;  Laterality: N/A;  1030   COLONOSCOPY  WITH PROPOFOL  N/A 12/25/2019   Procedure: COLONOSCOPY WITH PROPOFOL ;  Surgeon: Urban Garden, MD;  [facility];  Service: Gastroenterology;  Laterality: N/A;  1015   COLONOSCOPY WITH PROPOFOL  N/A 04/26/2023   Procedure: COLONOSCOPY WITH PROPOFOL ;  Surgeon: Urban Garden, MD;  [facility];  Service: Gastroenterology;  Laterality: N/A;  1:45PM;ASA 1-2   HEMOSTASIS CLIP PLACEMENT  04/26/2023   Procedure: HEMOSTASIS CLIP PLACEMENT;  Surgeon: Umberto Ganong, Bearl Limes, MD;  [facility];  Service: Gastroenterology;;   POLYPECTOMY  11/14/2018   Procedure: POLYPECTOMY;  Surgeon: Ruby Corporal, MD;  [facility];  Service: Endoscopy;;  colon   POLYPECTOMY  12/25/2019   Procedure: POLYPECTOMY;  Surgeon: Umberto Ganong, Bearl Limes, MD;  [facility];  Service: Gastroenterology;;   POLYPECTOMY  04/26/2023   Procedure: POLYPECTOMY;  Surgeon: Umberto Ganong, Bearl Limes, MD;  [facility];  Service: Gastroenterology;;   REPLACEMENT TOTAL KNEE Right    SUBMUCOSAL LIFTING INJECTION  04/26/2023   Procedure: SUBMUCOSAL LIFTING INJECTION;  Surgeon: Urban Garden, MD;  [facility];  Service: Gastroenterology;;    MEDICATIONS:  acetaminophen (TYLENOL) 500 MG tablet   allopurinol (ZYLOPRIM) 100 MG tablet   amoxicillin  (AMOXIL ) 500 MG capsule   aspirin EC 81 MG tablet   CALCIUM PO   Carboxymethylcellul-Glycerin (LUBRICATING EYE DROPS OP)   carvedilol (COREG) 12.5 MG tablet   cetirizine (ZYRTEC) 10 MG tablet   cholecalciferol (VITAMIN D3) 25 MCG (1000 UT) tablet   diclofenac sodium (VOLTAREN) 1 % GEL   enalapril (VASOTEC) 20 MG tablet   furosemide  (LASIX ) 20 MG tablet   loratadine (CLARITIN) 10 MG tablet   metFORMIN (GLUCOPHAGE-XR) 500 MG 24 hr tablet   Potassium Chloride ER 20 MEQ TBCR   pravastatin (PRAVACHOL) 20 MG tablet   sodium chloride  (OCEAN) 0.65 % SOLN nasal spray   No current  facility-administered medications for this encounter.   Antoinette Kirschner [facility] Short Stay/Anesthesiology [facility] Phone [phone] 08/03/2023 9:00 AM

## 2023-08-14 ENCOUNTER — Encounter (HOSPITAL_COMMUNITY)
Admission: RE | Disposition: A | Discharge status: Home / Self Care | Payer: Self-pay | Source: Home / Self Care | Attending: Orthopedic Surgery

## 2023-08-14 ENCOUNTER — Observation Stay (HOSPITAL_COMMUNITY)
Admission: RE | Admit: 2023-08-14 | Discharge: 2023-08-15 | Disposition: A | Discharge status: Home / Self Care | Attending: Orthopedic Surgery | Admitting: Orthopedic Surgery

## 2023-08-14 ENCOUNTER — Other Ambulatory Visit: Payer: Self-pay

## 2023-08-14 ENCOUNTER — Ambulatory Visit (HOSPITAL_COMMUNITY): Payer: Self-pay | Admitting: Medical

## 2023-08-14 ENCOUNTER — Observation Stay (HOSPITAL_COMMUNITY)

## 2023-08-14 ENCOUNTER — Encounter (HOSPITAL_COMMUNITY): Payer: Self-pay | Admitting: Orthopedic Surgery

## 2023-08-14 ENCOUNTER — Ambulatory Visit (HOSPITAL_COMMUNITY): Payer: Self-pay | Admitting: Anesthesiology

## 2023-08-14 ENCOUNTER — Ambulatory Visit (HOSPITAL_COMMUNITY)

## 2023-08-14 DIAGNOSIS — Z01818 Encounter for other preprocedural examination: Secondary | ICD-10-CM

## 2023-08-14 DIAGNOSIS — Z96651 Presence of right artificial knee joint: Secondary | ICD-10-CM | POA: Insufficient documentation

## 2023-08-14 DIAGNOSIS — Z471 Aftercare following joint replacement surgery: Secondary | ICD-10-CM | POA: Diagnosis not present

## 2023-08-14 DIAGNOSIS — Z79899 Other long term (current) drug therapy: Secondary | ICD-10-CM | POA: Insufficient documentation

## 2023-08-14 DIAGNOSIS — M160 Bilateral primary osteoarthritis of hip: Secondary | ICD-10-CM | POA: Diagnosis not present

## 2023-08-14 DIAGNOSIS — Z7982 Long term (current) use of aspirin: Secondary | ICD-10-CM | POA: Diagnosis not present

## 2023-08-14 DIAGNOSIS — E119 Type 2 diabetes mellitus without complications: Secondary | ICD-10-CM

## 2023-08-14 DIAGNOSIS — E1122 Type 2 diabetes mellitus with diabetic chronic kidney disease: Secondary | ICD-10-CM | POA: Insufficient documentation

## 2023-08-14 DIAGNOSIS — N181 Chronic kidney disease, stage 1: Secondary | ICD-10-CM | POA: Diagnosis not present

## 2023-08-14 DIAGNOSIS — I129 Hypertensive chronic kidney disease with stage 1 through stage 4 chronic kidney disease, or unspecified chronic kidney disease: Secondary | ICD-10-CM | POA: Insufficient documentation

## 2023-08-14 DIAGNOSIS — Z7984 Long term (current) use of oral hypoglycemic drugs: Secondary | ICD-10-CM | POA: Insufficient documentation

## 2023-08-14 DIAGNOSIS — M1612 Unilateral primary osteoarthritis, left hip: Principal | ICD-10-CM | POA: Diagnosis present

## 2023-08-14 DIAGNOSIS — Z96642 Presence of left artificial hip joint: Secondary | ICD-10-CM | POA: Diagnosis not present

## 2023-08-14 DIAGNOSIS — M1611 Unilateral primary osteoarthritis, right hip: Secondary | ICD-10-CM | POA: Diagnosis not present

## 2023-08-14 DIAGNOSIS — I10 Essential (primary) hypertension: Secondary | ICD-10-CM

## 2023-08-14 HISTORY — PX: TOTAL HIP ARTHROPLASTY: SHX124

## 2023-08-14 LAB — ABO/RH: ABO/RH(D): AB NEG

## 2023-08-14 LAB — GLUCOSE, CAPILLARY
Glucose-Capillary: 117 mg/dL — ABNORMAL HIGH (ref 70–99)
Glucose-Capillary: 122 mg/dL — ABNORMAL HIGH (ref 70–99)
Glucose-Capillary: 145 mg/dL — ABNORMAL HIGH (ref 70–99)
Glucose-Capillary: 162 mg/dL — ABNORMAL HIGH (ref 70–99)

## 2023-08-14 SURGERY — ARTHROPLASTY, HIP, TOTAL,POSTERIOR APPROACH
Anesthesia: Spinal | Site: Hip | Laterality: Left

## 2023-08-14 MED ORDER — OMEPRAZOLE 40 MG PO CPDR: 40.0000 mg | DELAYED_RELEASE_CAPSULE | Freq: Every day | ORAL | 0 refills | Status: AC

## 2023-08-14 MED ORDER — FUROSEMIDE 20 MG PO TABS: 20.0000 mg | ORAL_TABLET | Freq: Every day | ORAL | Status: DC | PRN

## 2023-08-14 MED ORDER — BUPIVACAINE LIPOSOME 1.3 % IJ SUSP
INTRAMUSCULAR | Status: AC
  Filled 2023-08-14: qty 20

## 2023-08-14 MED ORDER — METHOCARBAMOL 500 MG PO TABS
500.0000 mg | ORAL_TABLET | Freq: Four times a day (QID) | ORAL | Status: DC | PRN
  Administered 2023-08-14 – 2023-08-15 (×2): 500 mg via ORAL
  Filled 2023-08-14 (×2): qty 1

## 2023-08-14 MED ORDER — EPHEDRINE SULFATE-NACL 50-0.9 MG/10ML-% IV SOSY
PREFILLED_SYRINGE | INTRAVENOUS | Status: DC | PRN
  Administered 2023-08-14 (×3): 5 mg via INTRAVENOUS
  Administered 2023-08-14: 10 mg via INTRAVENOUS
  Administered 2023-08-14 (×4): 5 mg via INTRAVENOUS

## 2023-08-14 MED ORDER — LIDOCAINE HCL (CARDIAC) PF 100 MG/5ML IV SOSY
PREFILLED_SYRINGE | INTRAVENOUS | Status: DC | PRN
  Administered 2023-08-14: 80 mg via INTRAVENOUS

## 2023-08-14 MED ORDER — ONDANSETRON HCL 4 MG/2ML IJ SOLN
INTRAMUSCULAR | Status: AC
  Filled 2023-08-14: qty 2

## 2023-08-14 MED ORDER — OXYCODONE HCL 5 MG PO TABS: 5.0000 mg | ORAL_TABLET | ORAL | 0 refills | Status: AC | PRN

## 2023-08-14 MED ORDER — DEXAMETHASONE SODIUM PHOSPHATE 10 MG/ML IJ SOLN
INTRAMUSCULAR | Status: AC
  Filled 2023-08-14: qty 1

## 2023-08-14 MED ORDER — MIDAZOLAM HCL 5 MG/5ML IJ SOLN
INTRAMUSCULAR | Status: DC | PRN
  Administered 2023-08-14: 2 mg via INTRAVENOUS

## 2023-08-14 MED ORDER — MENTHOL 3 MG MT LOZG: 1.0000 | LOZENGE | OROMUCOSAL | Status: DC | PRN

## 2023-08-14 MED ORDER — SENNA 8.6 MG PO TABS
1.0000 | ORAL_TABLET | Freq: Two times a day (BID) | ORAL | Status: DC
Start: 2023-08-14 — End: 2023-08-15
  Administered 2023-08-14 – 2023-08-15 (×3): 8.6 mg via ORAL
  Filled 2023-08-14 (×3): qty 1

## 2023-08-14 MED ORDER — ONDANSETRON HCL 4 MG/2ML IJ SOLN
INTRAMUSCULAR | Status: DC | PRN
Start: 2023-08-14 — End: 2023-08-14
  Administered 2023-08-14: 4 mg via INTRAVENOUS

## 2023-08-14 MED ORDER — PROPOFOL 10 MG/ML IV BOLUS
INTRAVENOUS | Status: AC
  Filled 2023-08-14: qty 20

## 2023-08-14 MED ORDER — ALLOPURINOL 100 MG PO TABS: 100.0000 mg | ORAL_TABLET | Freq: Every day | ORAL | Status: DC | PRN

## 2023-08-14 MED ORDER — DOCUSATE SODIUM 100 MG PO CAPS
100.0000 mg | ORAL_CAPSULE | Freq: Two times a day (BID) | ORAL | Status: DC
Start: 2023-08-14 — End: 2023-08-15
  Administered 2023-08-14 – 2023-08-15 (×3): 100 mg via ORAL
  Filled 2023-08-14 (×3): qty 1

## 2023-08-14 MED ORDER — ZOLPIDEM TARTRATE 5 MG PO TABS: 5.0000 mg | ORAL_TABLET | Freq: Every evening | ORAL | Status: DC | PRN

## 2023-08-14 MED ORDER — PROPOFOL 1000 MG/100ML IV EMUL
INTRAVENOUS | Status: AC
  Filled 2023-08-14: qty 100

## 2023-08-14 MED ORDER — ACETAMINOPHEN 325 MG PO TABS: 325.0000 mg | ORAL_TABLET | Freq: Four times a day (QID) | ORAL | Status: DC | PRN

## 2023-08-14 MED ORDER — ORAL CARE MOUTH RINSE: 15.0000 mL | Freq: Once | OROMUCOSAL | Status: AC

## 2023-08-14 MED ORDER — LACTATED RINGERS IV SOLN: INTRAVENOUS | Status: DC

## 2023-08-14 MED ORDER — FENTANYL CITRATE PF 50 MCG/ML IJ SOSY: 25.0000 ug | PREFILLED_SYRINGE | INTRAMUSCULAR | Status: DC | PRN

## 2023-08-14 MED ORDER — CHLORHEXIDINE GLUCONATE 0.12 % MT SOLN
15.0000 mL | Freq: Once | OROMUCOSAL | Status: AC
  Administered 2023-08-14: 15 mL via OROMUCOSAL

## 2023-08-14 MED ORDER — DIPHENHYDRAMINE HCL 12.5 MG/5ML PO ELIX: 12.5000 mg | ORAL_SOLUTION | ORAL | Status: DC | PRN

## 2023-08-14 MED ORDER — EPHEDRINE 5 MG/ML INJ
INTRAVENOUS | Status: AC
  Filled 2023-08-14: qty 5

## 2023-08-14 MED ORDER — METHOCARBAMOL 500 MG PO TABS: 500.0000 mg | ORAL_TABLET | Freq: Three times a day (TID) | ORAL | 0 refills | Status: AC | PRN

## 2023-08-14 MED ORDER — CEFAZOLIN SODIUM-DEXTROSE 2-4 GM/100ML-% IV SOLN
2.0000 g | Freq: Four times a day (QID) | INTRAVENOUS | Status: AC
  Administered 2023-08-14 (×2): 2 g via INTRAVENOUS
  Filled 2023-08-14 (×2): qty 100

## 2023-08-14 MED ORDER — PHENYLEPHRINE 80 MCG/ML (10ML) SYRINGE FOR IV PUSH (FOR BLOOD PRESSURE SUPPORT)
PREFILLED_SYRINGE | INTRAVENOUS | Status: AC
  Filled 2023-08-14: qty 10

## 2023-08-14 MED ORDER — WATER FOR IRRIGATION, STERILE IR SOLN
Status: DC | PRN
Start: 2023-08-14 — End: 2023-08-14
  Administered 2023-08-14: 2000 mL

## 2023-08-14 MED ORDER — POLYVINYL ALCOHOL 1.4 % OP SOLN: 1.0000 [drp] | Freq: Three times a day (TID) | OPHTHALMIC | Status: DC | PRN

## 2023-08-14 MED ORDER — POLYETHYLENE GLYCOL 3350 17 G PO PACK
17.0000 g | PACK | Freq: Every day | ORAL | Status: DC | PRN
Start: 2023-08-14 — End: 2023-08-15

## 2023-08-14 MED ORDER — PROPOFOL 10 MG/ML IV BOLUS
INTRAVENOUS | Status: DC | PRN
  Administered 2023-08-14: 75 ug/kg/min via INTRAVENOUS
  Administered 2023-08-14: 80 mg via INTRAVENOUS

## 2023-08-14 MED ORDER — POVIDONE-IODINE 10 % EX SWAB: 2.0000 | Freq: Once | CUTANEOUS | Status: DC

## 2023-08-14 MED ORDER — BUPIVACAINE IN DEXTROSE 0.75-8.25 % IT SOLN
INTRATHECAL | Status: DC | PRN
  Administered 2023-08-14: 2 mL via INTRATHECAL

## 2023-08-14 MED ORDER — ASPIRIN 81 MG PO CHEW
81.0000 mg | CHEWABLE_TABLET | Freq: Two times a day (BID) | ORAL | Status: DC
  Administered 2023-08-14 – 2023-08-15 (×2): 81 mg via ORAL
  Filled 2023-08-14 (×2): qty 1

## 2023-08-14 MED ORDER — GLYCOPYRROLATE 0.2 MG/ML IJ SOLN
INTRAMUSCULAR | Status: DC | PRN
  Administered 2023-08-14: .2 mg via INTRAVENOUS

## 2023-08-14 MED ORDER — POLYETHYLENE GLYCOL 3350 17 G PO PACK: 17.0000 g | PACK | Freq: Every day | ORAL | 0 refills | Status: AC

## 2023-08-14 MED ORDER — SODIUM CHLORIDE 0.9 % IR SOLN
Status: DC | PRN
  Administered 2023-08-14: 3000 mL
  Administered 2023-08-14: 250 mL

## 2023-08-14 MED ORDER — AMISULPRIDE (ANTIEMETIC) 5 MG/2ML IV SOLN: 10.0000 mg | Freq: Once | INTRAVENOUS | Status: DC | PRN

## 2023-08-14 MED ORDER — TRANEXAMIC ACID-NACL 1000-0.7 MG/100ML-% IV SOLN
1000.0000 mg | INTRAVENOUS | Status: AC
  Administered 2023-08-14: 1000 mg via INTRAVENOUS
  Filled 2023-08-14: qty 100

## 2023-08-14 MED ORDER — PHENYLEPHRINE HCL-NACL 20-0.9 MG/250ML-% IV SOLN
INTRAVENOUS | Status: DC | PRN
  Administered 2023-08-14: 20 ug/min via INTRAVENOUS

## 2023-08-14 MED ORDER — ROCURONIUM BROMIDE 10 MG/ML (PF) SYRINGE
PREFILLED_SYRINGE | INTRAVENOUS | Status: AC
  Filled 2023-08-14: qty 10

## 2023-08-14 MED ORDER — PRAVASTATIN SODIUM 20 MG PO TABS
20.0000 mg | ORAL_TABLET | Freq: Every day | ORAL | Status: DC
  Administered 2023-08-14: 20 mg via ORAL
  Filled 2023-08-14: qty 1

## 2023-08-14 MED ORDER — CARVEDILOL 12.5 MG PO TABS
12.5000 mg | ORAL_TABLET | Freq: Two times a day (BID) | ORAL | Status: DC
  Administered 2023-08-14 – 2023-08-15 (×2): 12.5 mg via ORAL
  Filled 2023-08-14 (×2): qty 1

## 2023-08-14 MED ORDER — MIDAZOLAM HCL 2 MG/2ML IJ SOLN
INTRAMUSCULAR | Status: AC
  Filled 2023-08-14: qty 2

## 2023-08-14 MED ORDER — ONDANSETRON HCL 4 MG/2ML IJ SOLN: 4.0000 mg | Freq: Four times a day (QID) | INTRAMUSCULAR | Status: DC | PRN

## 2023-08-14 MED ORDER — SODIUM CHLORIDE (PF) 0.9 % IJ SOLN
INTRAMUSCULAR | Status: AC
  Filled 2023-08-14: qty 30

## 2023-08-14 MED ORDER — ACETAMINOPHEN 500 MG PO TABS
1000.0000 mg | ORAL_TABLET | Freq: Once | ORAL | Status: AC
Start: 2023-08-14 — End: 2023-08-14
  Administered 2023-08-14: 1000 mg via ORAL
  Filled 2023-08-14: qty 2

## 2023-08-14 MED ORDER — BUPIVACAINE LIPOSOME 1.3 % IJ SUSP: 10.0000 mL | Freq: Once | INTRAMUSCULAR | Status: DC

## 2023-08-14 MED ORDER — POTASSIUM CHLORIDE CRYS ER 20 MEQ PO TBCR: 20.0000 meq | EXTENDED_RELEASE_TABLET | Freq: Every day | ORAL | Status: DC | PRN

## 2023-08-14 MED ORDER — PHENOL 1.4 % MT LIQD
1.0000 | OROMUCOSAL | Status: DC | PRN
Start: 2023-08-14 — End: 2023-08-15

## 2023-08-14 MED ORDER — ACETAMINOPHEN 500 MG PO TABS: 1000.0000 mg | ORAL_TABLET | Freq: Three times a day (TID) | ORAL | Status: AC | PRN

## 2023-08-14 MED ORDER — DEXAMETHASONE SODIUM PHOSPHATE 10 MG/ML IJ SOLN
4.0000 mg | Freq: Once | INTRAMUSCULAR | Status: AC
  Administered 2023-08-14: 4 mg via INTRAVENOUS

## 2023-08-14 MED ORDER — LACTATED RINGERS IV SOLN
INTRAVENOUS | Status: DC
Start: 2023-08-14 — End: 2023-08-15

## 2023-08-14 MED ORDER — GLYCOPYRROLATE 0.2 MG/ML IJ SOLN
INTRAMUSCULAR | Status: AC
  Filled 2023-08-14: qty 1

## 2023-08-14 MED ORDER — SODIUM CHLORIDE (PF) 0.9 % IJ SOLN
INTRAMUSCULAR | Status: DC | PRN
  Administered 2023-08-14: 80 mL

## 2023-08-14 MED ORDER — ASPIRIN 81 MG PO TBEC: 81.0000 mg | DELAYED_RELEASE_TABLET | Freq: Two times a day (BID) | ORAL | Status: AC

## 2023-08-14 MED ORDER — METHOCARBAMOL 1000 MG/10ML IJ SOLN: 500.0000 mg | Freq: Four times a day (QID) | INTRAMUSCULAR | Status: DC | PRN

## 2023-08-14 MED ORDER — ONDANSETRON HCL 4 MG PO TABS: 4.0000 mg | ORAL_TABLET | Freq: Three times a day (TID) | ORAL | 0 refills | Status: AC | PRN

## 2023-08-14 MED ORDER — OXYCODONE HCL 5 MG PO TABS
5.0000 mg | ORAL_TABLET | ORAL | Status: DC | PRN
  Administered 2023-08-14 (×2): 5 mg via ORAL
  Administered 2023-08-15: 10 mg via ORAL
  Filled 2023-08-14: qty 2
  Filled 2023-08-14 (×2): qty 1

## 2023-08-14 MED ORDER — LIDOCAINE HCL (PF) 2 % IJ SOLN
INTRAMUSCULAR | Status: AC
  Filled 2023-08-14: qty 5

## 2023-08-14 MED ORDER — INSULIN ASPART 100 UNIT/ML IJ SOLN: 0.0000 [IU] | INTRAMUSCULAR | Status: DC | PRN

## 2023-08-14 MED ORDER — ISOPROPYL ALCOHOL 70 % SOLN
Status: DC | PRN
  Administered 2023-08-14: 1 via TOPICAL

## 2023-08-14 MED ORDER — SODIUM CHLORIDE 0.9 % IV SOLN: INTRAVENOUS | Status: DC

## 2023-08-14 MED ORDER — FENTANYL CITRATE (PF) 100 MCG/2ML IJ SOLN
INTRAMUSCULAR | Status: AC
  Filled 2023-08-14: qty 2

## 2023-08-14 MED ORDER — ISOPROPYL ALCOHOL 70 % SOLN
Status: AC
  Filled 2023-08-14: qty 480

## 2023-08-14 MED ORDER — SUCCINYLCHOLINE CHLORIDE 200 MG/10ML IV SOSY
PREFILLED_SYRINGE | INTRAVENOUS | Status: AC
  Filled 2023-08-14: qty 10

## 2023-08-14 MED ORDER — ONDANSETRON HCL 4 MG PO TABS: 4.0000 mg | ORAL_TABLET | Freq: Four times a day (QID) | ORAL | Status: DC | PRN

## 2023-08-14 MED ORDER — 0.9 % SODIUM CHLORIDE (POUR BTL) OPTIME
TOPICAL | Status: DC | PRN
  Administered 2023-08-14: 1000 mL

## 2023-08-14 MED ORDER — LORATADINE 10 MG PO TABS
10.0000 mg | ORAL_TABLET | Freq: Every day | ORAL | Status: DC
  Administered 2023-08-15: 10 mg via ORAL
  Filled 2023-08-14: qty 1

## 2023-08-14 MED ORDER — ENALAPRIL MALEATE 10 MG PO TABS
20.0000 mg | ORAL_TABLET | Freq: Every day | ORAL | Status: DC
  Administered 2023-08-14: 20 mg via ORAL
  Filled 2023-08-14: qty 2

## 2023-08-14 MED ORDER — PANTOPRAZOLE SODIUM 40 MG PO TBEC
40.0000 mg | DELAYED_RELEASE_TABLET | Freq: Every day | ORAL | Status: DC
  Administered 2023-08-14 – 2023-08-15 (×2): 40 mg via ORAL
  Filled 2023-08-14 (×2): qty 1

## 2023-08-14 MED ORDER — METFORMIN HCL ER 500 MG PO TB24
500.0000 mg | ORAL_TABLET | Freq: Every day | ORAL | Status: DC
  Administered 2023-08-14: 500 mg via ORAL
  Filled 2023-08-14: qty 1

## 2023-08-14 MED ORDER — HYDROMORPHONE HCL 1 MG/ML IJ SOLN: 0.5000 mg | INTRAMUSCULAR | Status: DC | PRN

## 2023-08-14 MED ORDER — CEFAZOLIN SODIUM-DEXTROSE 2-4 GM/100ML-% IV SOLN
2.0000 g | INTRAVENOUS | Status: AC
  Administered 2023-08-14: 2 g via INTRAVENOUS
  Filled 2023-08-14: qty 100

## 2023-08-14 MED ORDER — BUPIVACAINE-EPINEPHRINE (PF) 0.25% -1:200000 IJ SOLN
INTRAMUSCULAR | Status: AC
  Filled 2023-08-14: qty 30

## 2023-08-14 MED ORDER — ACETAMINOPHEN 500 MG PO TABS
1000.0000 mg | ORAL_TABLET | Freq: Four times a day (QID) | ORAL | Status: AC
  Administered 2023-08-14 – 2023-08-15 (×4): 1000 mg via ORAL
  Filled 2023-08-14 (×4): qty 2

## 2023-08-14 MED ORDER — INSULIN ASPART 100 UNIT/ML IJ SOLN
0.0000 [IU] | Freq: Three times a day (TID) | INTRAMUSCULAR | Status: DC
  Administered 2023-08-14: 2 [IU] via SUBCUTANEOUS

## 2023-08-14 SURGICAL SUPPLY — 65 items
BAG COUNTER SPONGE SURGICOUNT (BAG) IMPLANT
BAG ZIPLOCK 12X15 (MISCELLANEOUS) ×1 IMPLANT
BIT DRILL TRIDENT 4X25 SU (BIT) IMPLANT
BLADE SAW SAG 25X90X1.19 (BLADE) ×1 IMPLANT
CEMENT BONE SIMPLEX SPEEDSET (Cement) IMPLANT
CHLORAPREP W/TINT 26 (MISCELLANEOUS) ×2 IMPLANT
CNTNR URN SCR LID CUP LEK RST (MISCELLANEOUS) ×1 IMPLANT
COVER SURGICAL LIGHT HANDLE (MISCELLANEOUS) ×1 IMPLANT
DERMABOND ADVANCED .7 DNX12 (GAUZE/BANDAGES/DRESSINGS) ×2 IMPLANT
DRAPE HIP W/POCKET STRL (MISCELLANEOUS) ×1 IMPLANT
DRAPE INCISE IOBAN 85X60 (DRAPES) ×2 IMPLANT
DRAPE POUCH INSTRU U-SHP 10X18 (DRAPES) ×1 IMPLANT
DRAPE SHEET LG 3/4 BI-LAMINATE (DRAPES) ×3 IMPLANT
DRAPE U-SHAPE 47X51 STRL (DRAPES) ×2 IMPLANT
DRSG AQUACEL AG ADV 3.5X10 (GAUZE/BANDAGES/DRESSINGS) ×1 IMPLANT
ELECT BLADE TIP CTD 4 INCH (ELECTRODE) ×1 IMPLANT
ELECT REM PT RETURN 15FT ADLT (MISCELLANEOUS) ×1 IMPLANT
GAUZE SPONGE 4X4 12PLY STRL (GAUZE/BANDAGES/DRESSINGS) ×1 IMPLANT
GLOVE BIO SURGEON STRL SZ 6.5 (GLOVE) ×2 IMPLANT
GLOVE BIOGEL PI IND STRL 6.5 (GLOVE) ×1 IMPLANT
GLOVE BIOGEL PI IND STRL 8 (GLOVE) ×1 IMPLANT
GLOVE SURG ORTHO 8.0 STRL STRW (GLOVE) ×2 IMPLANT
GOWN STRL REUS W/ TWL XL LVL3 (GOWN DISPOSABLE) ×2 IMPLANT
HEAD BIOLOX HIP 36/-5 (Joint) IMPLANT
HOLDER FOLEY CATH W/STRAP (MISCELLANEOUS) ×1 IMPLANT
HOOD PEEL AWAY T7 (MISCELLANEOUS) ×3 IMPLANT
INSERT TRIDENT POLY 36 0DEG (Insert) IMPLANT
KIT BASIN OR (CUSTOM PROCEDURE TRAY) ×1 IMPLANT
KIT TURNOVER KIT A (KITS) IMPLANT
MANIFOLD NEPTUNE II (INSTRUMENTS) ×1 IMPLANT
MARKER SKIN DUAL TIP RULER LAB (MISCELLANEOUS) ×1 IMPLANT
NDL SAFETY ECLIPSE 18X1.5 (NEEDLE) ×2 IMPLANT
NS IRRIG 1000ML POUR BTL (IV SOLUTION) ×1 IMPLANT
PACK TOTAL JOINT (CUSTOM PROCEDURE TRAY) ×1 IMPLANT
PAD ARMBOARD POSITIONER FOAM (MISCELLANEOUS) ×1 IMPLANT
PENCIL SMOKE EVACUATOR (MISCELLANEOUS) IMPLANT
PROTECTOR NERVE ULNAR (MISCELLANEOUS) IMPLANT
RETRIEVER SUT HEWSON (MISCELLANEOUS) ×1 IMPLANT
SCREW HEX LP 6.5X20 (Screw) IMPLANT
SCREW HEX LP 6.5X25 (Screw) IMPLANT
SEALER BIPOLAR AQUA 6.0 (INSTRUMENTS) IMPLANT
SET HNDPC FAN SPRY TIP SCT (DISPOSABLE) IMPLANT
SHELL CLUSTERHOLE ACETABULAR 5 (Shell) IMPLANT
SHIELD FACE FULL FLUID (MISCELLANEOUS) ×1 IMPLANT
SOLUTION IRRIG SURGIPHOR (IV SOLUTION) IMPLANT
SOLUTION PRONTOSAN WOUND 350ML (IRRIGATION / IRRIGATOR) IMPLANT
SPIKE FLUID TRANSFER (MISCELLANEOUS) ×1 IMPLANT
STEM HIP 4 127DEG (Stem) IMPLANT
SUCTION TUBE FRAZIER 12FR DISP (SUCTIONS) ×1 IMPLANT
SUT BONE WAX W31G (SUTURE) ×1 IMPLANT
SUT ETHIBOND #5 BRAIDED 30INL (SUTURE) ×1 IMPLANT
SUT MNCRL AB 3-0 PS2 18 (SUTURE) ×1 IMPLANT
SUT STRATAFIX 14 PDO 48 VLT (SUTURE) ×1 IMPLANT
SUT VIC AB 0 CT1 36 (SUTURE) IMPLANT
SUT VIC AB 2-0 CT2 27 (SUTURE) ×2 IMPLANT
SUTURE STRATFX 0 PDS 27 VIOLET (SUTURE) ×1 IMPLANT
SYR 30ML LL (SYRINGE) ×1 IMPLANT
SYR 50ML LL SCALE MARK (SYRINGE) ×1 IMPLANT
TOWEL OR 17X26 10 PK STRL BLUE (TOWEL DISPOSABLE) ×1 IMPLANT
TOWER CARTRIDGE SMART MIX (DISPOSABLE) IMPLANT
TRAY FOLEY MTR SLVR 14FR STAT (SET/KITS/TRAYS/PACK) IMPLANT
TRAY FOLEY MTR SLVR 16FR STAT (SET/KITS/TRAYS/PACK) IMPLANT
TUBE SUCTION HIGH CAP CLEAR NV (SUCTIONS) ×1 IMPLANT
UNDERPAD 30X36 HEAVY ABSORB (UNDERPADS AND DIAPERS) ×1 IMPLANT
WATER STERILE IRR 1000ML POUR (IV SOLUTION) ×2 IMPLANT

## 2023-08-14 NOTE — Anesthesia Procedure Notes (Signed)
 Spinal  Patient location during procedure: OR Start time: 08/14/2023 7:24 AM End time: 08/14/2023 7:29 AM Reason for block: surgical anesthesia Staffing Performed: anesthesiologist  Anesthesiologist: Peggy Bowens, MD Performed by: Peggy Bowens, MD Authorized by: Peggy Bowens, MD   Preanesthetic Checklist Completed: patient identified, IV checked, site marked, risks and benefits discussed, surgical consent, monitors and equipment checked, pre-op evaluation and timeout performed Spinal Block Patient position: sitting Prep: DuraPrep Patient monitoring: heart rate, cardiac monitor, continuous pulse ox and blood pressure Approach: midline Location: L4-5 Injection technique: single-shot Needle Needle type: Pencan  Needle gauge: 24 G Needle length: 9 cm Assessment Sensory level: T4 Events: CSF return

## 2023-08-14 NOTE — Op Note (Signed)
 08/14/2023  8:54 AM  PATIENT:  Terri Miranda   MRN: [id number]  PRE-OPERATIVE DIAGNOSIS:  end stage left hip osteoarthritis  POST-OPERATIVE DIAGNOSIS:  same  PROCEDURE:  LEFT TOTAL HIP POSTERIOR APPROACH  PREOPERATIVE INDICATIONS:    Terri Miranda is an 78 y.o. female who has a diagnosis of end stage left hip osteoarthritis and elected for surgical management after failing conservative treatment.  The risks benefits and alternatives were discussed with the patient including but not limited to the risks of nonoperative treatment, versus surgical intervention including infection, bleeding, nerve injury, periprosthetic fracture, the need for revision surgery, dislocation, leg length discrepancy, blood clots, cardiopulmonary complications, morbidity, mortality, among others, and they were willing to proceed.     OPERATIVE REPORT     SURGEON:  Priscille Brought, MD    ASSISTANT:  Mason Sole, PA-C, (Present throughout the entire procedure,  necessary for completion of procedure in a timely manner, assisting with retraction, instrumentation, and closure)     ANESTHESIA:  spinal  ESTIMATED BLOOD LOSS: 250cc    COMPLICATIONS:  None.     COMPONENTS:   Stryker Trident 252 mm acetabular shell, 6.5 peg screws x 2, Trident X3 polyethylene liner, Accolade 2 with 127 degree neck angle size #4 stem, 36-5 mm ceramic head Implant Name Type Inv. Item Serial No. Manufacturer Lot No. LRB No. Used Action  SHELL CLUSTERHOLE ACETABULAR 5 - [id number] Shell SHELL CLUSTERHOLE ACETABULAR 5  [employer] [id number] Left 1 Implanted  SCREW HEX LP 6.5X20 - [id number] Screw SCREW HEX LP 6.5X20  [employer] [id number] Left 1 Implanted  SCREW HEX LP 6.5X25 - [id number] Screw SCREW HEX LP 6.5X25  [employer] [id number] Left 1 Implanted  INSERT TRIDENT POLY 36 0DEG - [id number] Insert INSERT TRIDENT POLY 36 0DEG  [employer] [id number] Left 1 Implanted  STEM HIP 4 127DEG - [id number] Stem  STEM HIP 4 127DEG  [employer] [id number] Left 1 Implanted  HEAD BIOLOX HIP 36/-5 - [id number] Joint HEAD BIOLOX HIP 36/-5  [employer] [id number] Left 1 Implanted    The aquamantis was utilized for this case to help facilitate better hemostasis as patient was felt to be at increased risk of bleeding because of obesity.     PROCEDURE IN DETAIL:   The patient was met in the holding area and  identified.  The appropriate hip was identified and marked at the operative site.  The patient was then transported to the OR  and  placed under anesthesia.  At that point, the patient was  placed in the lateral decubitus position with the operative side up and  secured to the operating room table  and all bony prominences padded. A subaxillary role was also placed.    The operative lower extremity was prepped from the iliac crest to the distal leg.  Sterile draping was performed.  Preoperative antibiotics, 2 gm of ancef,1 gm of Tranexamic Acid, and 8 mg of Decadron administered. Time out was performed prior to incision.      A routine posterolateral approach was utilized via sharp dissection  carried down to the subcutaneous tissue.  Gross bleeders were Bovie coagulated.  The iliotibial band was identified and incised along the length of the skin incision through the glute max fascia.  Charnley retractor was placed with care to protect the sciatic nerve posteriorly.  With the hip internally rotated, the piriformis tendon was identified and released from the femoral insertion and tagged with a #5 Ethibond.  A capsulotomy was  then performed off the femoral insertion and also tagged with a #5 Ethibond.    The femoral neck was exposed, and I resected the femoral neck based on preoperative templating relative to the lesser trochanter.    I then exposed the deep acetabulum, cleared out any tissue including the ligamentum teres.  After adequate visualization, I excised the labrum.  I then started reaming  with a 46 mm reamer, first medializing to the floor of the cotyloid fossa, and then in the position of the cup aiming towards the greater sciatic notch, matching the version of the transverse acetabular ligament and tucked under the anterior wall. I reamed up to 52 mm reamer with good bony bed preparation and a 52 mm cup was chosen.  The real cup was then impacted into place.  Appropriate version and inclination was confirmed clinically matching their bony anatomy, and also with the use of the jig.  I placed 2 screws in the posterior superior quadrant to augment fixation.  A neutral liner was placed and impacted. It was confirmed to be appropriately seated and the acetabular retractors were removed.    I then prepared the proximal femur using the box cutter, Charnley awl, and then sequentially broached starting with 0 up to a size 4.  A trial broach, neck, and head was utilized, and I reduced the hip and it was found to have excellent stability.  There was no impingement with full extension and 90 degrees external rotation.  The hip was stable at the position of sleep and with 90 degrees flexion and 80degrees of internal rotation.  Leg lengths were also clinically assessed in the lateral position and felt to be equal. Intra-Op flatplate was obtained and confirmed appropriate component positions.  Good fill of the femur with the size 4 broach.  And restoration of leg length and offset. No evidence or concern for fracture.  A final femoral prosthesis size 4 was selected. I then impacted the real femoral prosthesis into place.I again trialed and selected a 36 -5mm ball. The hip was then reduced and taken through a range of motion. There was no impingement with full extension and 90 degrees external rotation.  The hip was stable at the position of sleep and with 90 degrees flexion and 80degrees of internal rotation. Leg lengths were  again assessed and felt to be restored.  We then opened, and I impacted the  real head ball into place.  The posterior capsule was then closed with #5 Ethibond.  The piriformis was repaired through the base of the abductor tendon using a [place] suture passer.  I then irrigated the hip copiously with dilute Betadine and with normal saline pulse lavage. Periarticular injection was then performed with Exparel .   We repaired the fascia #1 barbed suture, followed by 0 barbed suture for the subcutaneous fat.  Skin was closed with 2-0 Vicryl and 3-0 Monocryl.  Dermabond and Aquacel dressing were applied. The patient was then awakened and returned to PACU in stable and satisfactory condition.  Leg lengths in the supine position were assessed and felt to be clinically equal. There were no complications.  Post op recs: WB: WBAT LLE, No formal hip precautions Abx: ancef Imaging: PACU pelvis Xray Dressing: Aquacell, keep intact until follow up DVT prophylaxis: Aspirin 81BID starting POD1 Follow up: 2 weeks after surgery for a wound check with Dr. Pryor Browning at [facility].  Address: [address], [place], [place] [address]  Office Phone: [phone]  Priscille Brought, MD Orthopedic Surgeon

## 2023-08-14 NOTE — Evaluation (Signed)
 Physical Therapy Evaluation Patient Details Name: Terri Miranda MRN: [id number] DOB: [birth year] Today's Date: 08/14/2023  History of Present Illness  78 y.o. female admitted 08/14/23 for L THA, posterior approach, no formal precautions. PMH: colon cancer, UTI, DM, R TKA.  Clinical Impression  Pt is s/p THA resulting in the deficits listed below (see PT Problem List). Pt ambulated 50' with RW, no loss of balance. Initiated THA HEP. Good progress expected.  Pt will benefit from acute skilled PT to increase their independence and safety with mobility to facilitate discharge.          If plan is discharge home, recommend the following: A little help with walking and/or transfers;A little help with bathing/dressing/bathroom;Assistance with cooking/housework;Assist for transportation;Help with stairs or ramp for entrance   Can travel by private vehicle        Equipment Recommendations None recommended by PT  Recommendations for Other Services       Functional Status Assessment Patient has had a recent decline in their functional status and demonstrates the ability to make significant improvements in function in a reasonable and predictable amount of time.     Precautions / Restrictions Precautions Precautions: Fall Recall of Precautions/Restrictions: Intact Restrictions Weight Bearing Restrictions Per Provider Order: No      Mobility  Bed Mobility Overal bed mobility: Needs Assistance Bed Mobility: Supine to Sit     Supine to sit: Min assist, HOB elevated, Used rails     General bed mobility comments: min A to advance LLE    Transfers Overall transfer level: Needs assistance Equipment used: Rolling walker (2 wheels) Transfers: Sit to/from Stand Sit to Stand: Contact guard assist, From elevated surface           General transfer comment: VCs hand placement    Ambulation/Gait Ambulation/Gait assistance: Contact guard assist Gait Distance (Feet): 50 Feet Assistive  device: Rolling walker (2 wheels) Gait Pattern/deviations: Step-to pattern, Decreased step length - right, Decreased step length - left Gait velocity: decr     General Gait Details: VCs sequencing  Stairs            Wheelchair Mobility     Tilt Bed    Modified Rankin (Stroke Patients Only)       Balance Overall balance assessment: Modified Independent                                           Pertinent Vitals/Pain Pain Assessment Pain Assessment: 0-10 Pain Score: 7  Pain Location: L hip Pain Descriptors / Indicators: Sore Pain Intervention(s): Limited activity within patient's tolerance, Monitored during session, Premedicated before session, Repositioned, Ice applied    Home Living Family/patient expects to be discharged to:: Private residence Living Arrangements: Spouse/significant other Available Help at Discharge: Family   Home Access: Stairs to enter Entrance Stairs-Rails: Right Entrance Stairs-Number of Steps: 4   Home Layout: Able to live on main level with bedroom/bathroom Home Equipment: Cane - single Librarian, academic (2 wheels)      Prior Function Prior Level of Function : Independent/Modified Independent             Mobility Comments: walks with SPC, no falls in past 6 months       Extremity/Trunk Assessment   Upper Extremity Assessment Upper Extremity Assessment: Overall WFL for tasks assessed    Lower Extremity Assessment Lower Extremity Assessment: LLE deficits/detail LLE Deficits /  Details: hip flexion 30* AAROM, 15* ABDuction AAROM LLE Sensation: WNL LLE Coordination: WNL       Communication   Communication Communication: No apparent difficulties    Cognition Arousal: Alert Behavior During Therapy: WFL for tasks assessed/performed   PT - Cognitive impairments: No apparent impairments                         Following commands: Intact       Cueing       General Comments       Exercises Total Joint Exercises Ankle Circles/Pumps: AROM, Both, 10 reps, Supine Heel Slides: AAROM, Left, 5 reps, Supine Hip abduction AAROM L; 5 reps, supine   Assessment/Plan    PT Assessment Patient needs continued PT services  PT Problem List Decreased activity tolerance;Decreased mobility;Pain;Decreased strength       PT Treatment Interventions Gait training;Therapeutic activities;Therapeutic exercise;Functional mobility training;Patient/family education;Balance training    PT Goals (Current goals can be found in the Care Plan section)  Acute Rehab PT Goals Patient Stated Goal: to walk better PT Goal Formulation: With patient Time For Goal Achievement: 08/21/23 Potential to Achieve Goals: Good    Frequency 7X/week     Co-evaluation               AM-PAC PT "6 Clicks" Mobility  Outcome Measure Help needed turning from your back to your side while in a flat bed without using bedrails?: A Little Help needed moving from lying on your back to sitting on the side of a flat bed without using bedrails?: A Little Help needed moving to and from a bed to a chair (including a wheelchair)?: A Little Help needed standing up from a chair using your arms (e.g., wheelchair or bedside chair)?: A Little Help needed to walk in hospital room?: A Little Help needed climbing 3-5 steps with a railing? : A Lot 6 Click Score: 17    End of Session Equipment Utilized During Treatment: Gait belt Activity Tolerance: Patient tolerated treatment well Patient left: in chair;with chair alarm set;with call bell/phone within reach;with family/visitor present Nurse Communication: Mobility status PT Visit Diagnosis: Difficulty in walking, not elsewhere classified (R26.2);Pain Pain - Right/Left: Left Pain - part of body: Hip    Time: 1610-9604 PT Time Calculation (min) (ACUTE ONLY): 26 min   Charges:   PT Evaluation $PT Eval Moderate Complexity: 1 Mod PT Treatments $Gait Training: 8-22  mins PT General Charges $$ ACUTE PT VISIT: 1 Visit         Daymon Evans PT 08/14/2023  Acute Rehabilitation Services  Office [phone]

## 2023-08-14 NOTE — Discharge Instructions (Signed)

## 2023-08-14 NOTE — Plan of Care (Signed)
  Problem: Activity: Goal: Risk for activity intolerance will decrease Outcome: Progressing   Problem: Nutrition: Goal: Adequate nutrition will be maintained Outcome: Progressing   Problem: Coping: Goal: Level of anxiety will decrease Outcome: Progressing   Problem: Elimination: Goal: Will not experience complications related to urinary retention Outcome: Progressing   Problem: Pain Managment: Goal: General experience of comfort will improve and/or be controlled Outcome: Progressing   Problem: Safety: Goal: Ability to remain free from injury will improve Outcome: Progressing   Problem: Skin Integrity: Goal: Risk for impaired skin integrity will decrease Outcome: Progressing   Problem: Pain Management: Goal: Pain level will decrease with appropriate interventions Outcome: Progressing

## 2023-08-14 NOTE — Transfer of Care (Signed)
 Immediate Anesthesia Transfer of Care Note  Patient: Terri Miranda  Procedure(s) Performed: ARTHROPLASTY, HIP, TOTAL,POSTERIOR APPROACH (Left: Hip)  Patient Location: PACU  Anesthesia Type:General and Spinal  Level of Consciousness: awake, alert , oriented, and patient cooperative  Airway & Oxygen Therapy: Patient Spontanous Breathing and Patient connected to face mask oxygen  Post-op Assessment: Report given to RN and Post -op Vital signs reviewed and stable  Post vital signs: Reviewed and stable  Last Vitals:  Vitals Value Taken Time  BP 99/55 08/14/23 0933  Temp 36.6   Pulse 71 08/14/23 0934  Resp 11 08/14/23 0934  SpO2 100 % 08/14/23 0934  Vitals shown include unfiled device data.  Last Pain:  Vitals:   08/14/23 0610  TempSrc:   PainSc: 0-No pain         Complications: No notable events documented.

## 2023-08-14 NOTE — Interval H&P Note (Signed)
The patient has been re-examined, and the chart reviewed, and there have been no interval changes to the documented history and physical.    Plan for L THA for left hip OA  The operative side was examined and the patient was confirmed to have sensation to DPN, SPN, TN intact, Motor EHL, ext, flex 5/5, and DP 2+, PT 2+, No significant edema.   The risks, benefits, and alternatives have been discussed at length with patient, and the patient is willing to proceed.  Left hip marked. Consent has been signed.  

## 2023-08-14 NOTE — Anesthesia Procedure Notes (Signed)
 Procedure Name: General with mask airway Date/Time: 08/14/2023 7:30 AM  Performed by: Carolynn Citrin, CRNAPre-anesthesia Checklist: Patient identified, Emergency Drugs available, Suction available, Patient being monitored and Timeout performed Patient Re-evaluated:Patient Re-evaluated prior to induction Oxygen Delivery Method: Simple face mask Preoxygenation: Pre-oxygenation with 100% oxygen Induction Type: IV induction Ventilation: Nasal airway inserted- appropriate to patient size Placement Confirmation: positive ETCO2 Dental Injury: Teeth and Oropharynx as per pre-operative assessment

## 2023-08-15 ENCOUNTER — Encounter (HOSPITAL_COMMUNITY): Payer: Self-pay | Admitting: Orthopedic Surgery

## 2023-08-15 DIAGNOSIS — M1612 Unilateral primary osteoarthritis, left hip: Secondary | ICD-10-CM | POA: Diagnosis not present

## 2023-08-15 LAB — BASIC METABOLIC PANEL WITH GFR
Anion gap: 5 (ref 5–15)
BUN: 19 mg/dL (ref 8–23)
CO2: 25 mmol/L (ref 22–32)
Calcium: 8.7 mg/dL — ABNORMAL LOW (ref 8.9–10.3)
Chloride: 103 mmol/L (ref 98–111)
Creatinine, Ser: 0.84 mg/dL (ref 0.44–1.00)
GFR, Estimated: >60 mL/min (ref >60)
Glucose, Bld: 149 mg/dL — ABNORMAL HIGH (ref 70–99)
Potassium: 4.5 mmol/L (ref 3.5–5.1)
Sodium: 133 mmol/L — ABNORMAL LOW (ref 135–145)

## 2023-08-15 LAB — CBC
HCT: 34.8 % — ABNORMAL LOW (ref 36.0–46.0)
Hemoglobin: 10.8 g/dL — ABNORMAL LOW (ref 12.0–15.0)
MCH: 28.9 pg (ref 26.0–34.0)
MCHC: 31 g/dL (ref 30.0–36.0)
MCV: 93 fL (ref 80.0–100.0)
Platelets: 128 10*3/uL — ABNORMAL LOW (ref 150–400)
RBC: 3.74 MIL/uL — ABNORMAL LOW (ref 3.87–5.11)
RDW: 14 % (ref 11.5–15.5)
WBC: 6.4 10*3/uL (ref 4.0–10.5)
nRBC: 0 % (ref 0.0–0.2)

## 2023-08-15 LAB — GLUCOSE, CAPILLARY: Glucose-Capillary: 88 mg/dL (ref 70–99)

## 2023-08-15 NOTE — Progress Notes (Signed)
     Subjective:  Patient reports pain as mild.  Worked well with physical therapy yesterday.  Slept okay overnight.  Plan for more mobilization with therapy today and hopeful for discharge home.  Denies distal numbness and tingling.  Objective:   VITALS:   Vitals:   08/14/23 2109 08/14/23 2155 08/15/23 0125 08/15/23 0605  BP: (!) 151/74 (!) 144/74 115/62 139/62  Pulse:  66 72 69  Resp:  17 17 17   Temp:  97.8 F (36.6 C) 98 F (36.7 C) 98 F (36.7 C)  TempSrc:  Oral Oral Oral  SpO2:  96% 100% 100%  Weight:      Height:        Sensation intact distally Intact pulses distally Dorsiflexion/Plantar flexion intact Incision: dressing C/D/I Compartment soft    Lab Results  Component Value Date   WBC 6.4 08/15/2023   HGB 10.8 (L) 08/15/2023   HCT 34.8 (L) 08/15/2023   MCV 93.0 08/15/2023   PLT 128 (L) 08/15/2023   BMET    Component Value Date/Time   NA 133 (L) 08/15/2023 0339   K 4.5 08/15/2023 0339   CL 103 08/15/2023 0339   CO2 25 08/15/2023 0339   GLUCOSE 149 (H) 08/15/2023 0339   BUN 19 08/15/2023 0339   CREATININE 0.84 08/15/2023 0339   CALCIUM 8.7 (L) 08/15/2023 0339   GFRNONAA >60 08/15/2023 0339      Xray: Total hip arthroplasty components in good position no adverse features  Assessment/Plan: 1 Day Post-Op   Principal Problem:   Primary osteoarthritis of left hip  S/p left total hip arthroplasty on 08/14/2023  Post op recs: WB: WBAT LLE, No formal hip precautions Abx: ancef Imaging: PACU pelvis Xray Dressing: Aquacell, keep intact until follow up DVT prophylaxis: Aspirin 81BID starting POD1 Follow up: 2 weeks after surgery for a wound check with Dr. Pryor Browning at [facility].  Address: [address], [place], [place] [address]  Office Phone: [phone]      Terri Miranda 08/15/2023, 7:11 AM   Priscille Brought, MD  Contact information:   [phone] 7am-5pm epic message Dr. Pryor Browning, or call office for  patient follow up: [phone] After hours and holidays please check [email or web address] for group call information for [facility]

## 2023-08-15 NOTE — Anesthesia Postprocedure Evaluation (Signed)
 Anesthesia Post Note  Patient: Terri Miranda  Procedure(s) Performed: ARTHROPLASTY, HIP, TOTAL,POSTERIOR APPROACH (Left: Hip)     Patient location during evaluation: PACU Anesthesia Type: Spinal Level of consciousness: awake and alert Pain management: pain level controlled Vital Signs Assessment: post-procedure vital signs reviewed and stable Respiratory status: spontaneous breathing and respiratory function stable Cardiovascular status: blood pressure returned to baseline and stable Postop Assessment: spinal receding Anesthetic complications: no   No notable events documented.  Last Vitals:  Vitals:   08/15/23 0125 08/15/23 0605  BP: 115/62 139/62  Pulse: 72 69  Resp: 17 17  Temp: 36.7 C 36.7 C  SpO2: 100% 100%    Last Pain:  Vitals:   08/15/23 0800  TempSrc:   PainSc: 0-No pain   Pain Goal: Patients Stated Pain Goal: 2 (08/14/23 1341)                 Melvenia Stabs

## 2023-08-15 NOTE — Plan of Care (Signed)

## 2023-08-15 NOTE — TOC Transition Note (Signed)
 Transition of Care [facility] - Discharge Note   Patient Details  Name: Terri Miranda MRN: [id number] Date of Birth: Jul 21, 1945  Transition of Care [facility]) CM/SW Contact:  Delilah Fend, LCSW Phone Number: 08/15/2023, 10:13 AM   Clinical Narrative:     Met with pt who confirms she has all needed DME in the home.  HHPT prearranged with [facility] via ortho MD office prior to surgery.  No further TOC needs.  Final next level of care: Home w Home Health Services Barriers to Discharge: No Barriers Identified   Patient Goals and CMS Choice Patient states their goals for this hospitalization and ongoing recovery are:: return home          Discharge Placement                       Discharge Plan and Services Additional resources added to the After Visit Summary for                  DME Arranged: N/A DME Agency: NA       HH Arranged: PT HH Agency: [facility]        Social Drivers of Health (SDOH) Interventions SDOH Screenings   Food Insecurity: No Food Insecurity (08/14/2023)  Housing: Low Risk  (08/14/2023)  Transportation Needs: No Transportation Needs (08/14/2023)  Utilities: Not At Risk (08/14/2023)  Depression (PHQ2-9): Low Risk  (03/31/2021)  Financial Resource Strain: Low Risk  (07/18/2019)  Social Connections: Moderately Integrated (08/14/2023)  Stress: No Stress Concern Present (07/18/2019)  Tobacco Use: Low Risk  (08/14/2023)     Readmission Risk Interventions     No data to display

## 2023-08-15 NOTE — Care Management Obs Status (Signed)
 MEDICARE OBSERVATION STATUS NOTIFICATION   Patient Details  Name: Terri Miranda MRN: [id number] Date of Birth: 02/07/1946   Medicare Observation Status Notification Given:  Yes    Delilah Fend, LCSW 08/15/2023, 10:16 AM

## 2023-08-15 NOTE — Progress Notes (Signed)
 IV removed, dressed, belongings packed, instructions reviewed with understanding verbalized, transferred to front entrance via wheelchair.

## 2023-08-15 NOTE — Discharge Summary (Signed)
 Physician Discharge Summary  Patient ID: Terri Miranda MRN: [id number] DOB/AGE: 1945-09-27 78 y.o.  Admit date: 08/14/2023 Discharge date: 08/15/2023  Admission Diagnoses:  Primary osteoarthritis of left hip  Discharge Diagnoses:  Principal Problem:   Primary osteoarthritis of left hip   Past Medical History:  Diagnosis Date   Arthritis    Chronic kidney disease    stage 1   Diabetes mellitus without complication (HCC)    Essential hypertension    Mitral valve prolapse    Mixed hyperlipidemia    Palpitations    PONV (postoperative nausea and vomiting)    Pre-diabetes     Surgeries: Procedure(s): ARTHROPLASTY, HIP, TOTAL,POSTERIOR APPROACH on 08/14/2023   Consultants (if any):   Discharged Condition: Improved  Hospital Course: Terri Miranda is an 78 y.o. female who was admitted 08/14/2023 with a diagnosis of Primary osteoarthritis of left hip and went to the operating room on 08/14/2023 and underwent the above named procedures.    She was given perioperative antibiotics:  Anti-infectives (From admission, onward)    Start     Dose/Rate Route Frequency Ordered Stop   08/14/23 1330  ceFAZolin (ANCEF) IVPB 2g/100 mL premix        2 g 200 mL/hr over 30 Minutes Intravenous Every 6 hours 08/14/23 1133 08/14/23 2133   08/14/23 0600  ceFAZolin (ANCEF) IVPB 2g/100 mL premix        2 g 200 mL/hr over 30 Minutes Intravenous On call to O.R. 08/14/23 [id number] 08/14/23 0801     .  She was given sequential compression devices, early ambulation, and aspirin for DVT prophylaxis.  She benefited maximally from the hospital stay and there were no complications.    Recent vital signs:  Vitals:   08/15/23 0125 08/15/23 0605  BP: 115/62 139/62  Pulse: 72 69  Resp: 17 17  Temp: 98 F (36.7 C) 98 F (36.7 C)  SpO2: 100% 100%    Recent laboratory studies:  Lab Results  Component Value Date   HGB 10.8 (L) 08/15/2023   HGB 11.7 (L) 08/02/2023   HGB 12.9 09/25/2018   Lab Results   Component Value Date   WBC 6.4 08/15/2023   PLT 128 (L) 08/15/2023   No results found for: "INR" Lab Results  Component Value Date   NA 133 (L) 08/15/2023   K 4.5 08/15/2023   CL 103 08/15/2023   CO2 25 08/15/2023   BUN 19 08/15/2023   CREATININE 0.84 08/15/2023   GLUCOSE 149 (H) 08/15/2023    Discharge Medications:   Allergies as of 08/15/2023       Reactions   Shellfish Allergy Anaphylaxis, Hives   Lovastatin    myalgias        Medication List     TAKE these medications    acetaminophen 500 MG tablet Commonly known as: TYLENOL Take 2 tablets (1,000 mg total) by mouth every 8 (eight) hours as needed. What changed:  how much to take when to take this reasons to take this   allopurinol 100 MG tablet Commonly known as: ZYLOPRIM Take 100 mg by mouth daily as needed (gout symptoms).   amoxicillin  500 MG capsule Commonly known as: AMOXIL  Take 2,000 mg by mouth See admin instructions. Take 4 capsules (2000 mg) by mouth 1 hour prior to dental appointments   aspirin EC 81 MG tablet Take 1 tablet (81 mg total) by mouth 2 (two) times daily for 28 days. Swallow whole. What changed:  when to take this additional  instructions   CALCIUM PO Take 1 tablet by mouth 2 (two) times a week.   carvedilol 12.5 MG tablet Commonly known as: COREG Take 12.5 mg by mouth 2 (two) times daily with a meal.   cetirizine 10 MG tablet Commonly known as: ZYRTEC Take 10 mg by mouth daily as needed for allergies.   cholecalciferol 25 MCG (1000 UNIT) tablet Commonly known as: VITAMIN D3 Take 1,000 Units by mouth daily.   diclofenac sodium 1 % Gel Commonly known as: VOLTAREN Apply 2 g topically 2 (two) times daily as needed (knee pain).   enalapril 20 MG tablet Commonly known as: VASOTEC Take 20 mg by mouth at bedtime.   furosemide  20 MG tablet Commonly known as: LASIX  Take 20 mg by mouth daily as needed (fluid retention.).   loratadine 10 MG tablet Commonly known as:  CLARITIN Take 10 mg by mouth daily as needed for allergies.   LUBRICATING EYE DROPS OP Place 1 drop into both eyes 3 (three) times daily as needed (dry/irritated eyes.).   metFORMIN 500 MG 24 hr tablet Commonly known as: GLUCOPHAGE-XR Take 500 mg by mouth at bedtime.   methocarbamol 500 MG tablet Commonly known as: ROBAXIN Take 1 tablet (500 mg total) by mouth every 8 (eight) hours as needed for up to 10 days for muscle spasms.   omeprazole 40 MG capsule Commonly known as: PRILOSEC Take 1 capsule (40 mg total) by mouth daily for 21 days.   ondansetron 4 MG tablet Commonly known as: Zofran Take 1 tablet (4 mg total) by mouth every 8 (eight) hours as needed for up to 14 days for nausea or vomiting.   oxyCODONE 5 MG immediate release tablet Commonly known as: Roxicodone Take 1 tablet (5 mg total) by mouth every 4 (four) hours as needed for up to 7 days for severe pain (pain score 7-10) or moderate pain (pain score 4-6).   polyethylene glycol 17 g packet Commonly known as: MiraLax Take 17 g by mouth daily.   Potassium Chloride ER 20 MEQ Tbcr Take 20 mEq by mouth daily as needed (fluid retention (with lasix )).   pravastatin 20 MG tablet Commonly known as: PRAVACHOL Take 20 mg by mouth at bedtime.   sodium chloride  0.65 % Soln nasal spray Commonly known as: OCEAN Place 1 spray into both nostrils 3 (three) times daily as needed for congestion.        Diagnostic Studies: DG HIP UNILAT WITH PELVIS 1V LEFT Result Date: 08/14/2023 CLINICAL DATA:  Postoperative state. EXAM: DG HIP (WITH OR WITHOUT PELVIS) 1V*L* COMPARISON:  Intraoperative AP pelvis 08/14/2023 FINDINGS: Interval completion of total left hip arthroplasty. No perihardware lucency is seen to indicate hardware failure or loosening. Expected postoperative lateral left hip subcutaneous air. Moderate right femoroacetabular joint space narrowing, subchondral sclerosis, and predominantly femoral head-neck junction peripheral  degenerative osteophytes. Mild pubic symphysis osteoarthritis. IMPRESSION: Interval completion of total left hip arthroplasty without evidence of hardware failure. Electronically Signed   By: Bertina Broccoli M.D.   On: 08/14/2023 12:58   DG Pelvis Portable Result Date: 08/14/2023 CLINICAL DATA:  Status post left hip arthroplasty EXAM: PORTABLE PELVIS 1 VIEWS COMPARISON:  None Available. FINDINGS: Single supine intraoperative view of the pelvis obtained during left hip arthroplasty. Hardware appears intact. Partially imaged degenerative changes of the right hip. IMPRESSION: Intraoperative view of the pelvis obtained during left hip arthroplasty. Electronically Signed   By: Limin  Xu M.D.   On: 08/14/2023 08:47    Disposition: Discharge disposition: 01-Home  or Self Care       Discharge Instructions     Call MD / Call 911   Complete by: As directed    If you experience chest pain or shortness of breath, CALL 911 and be transported to the hospital emergency room.  If you develope a fever above 101 F, pus (white drainage) or increased drainage or redness at the wound, or calf pain, call your surgeon's office.   Constipation Prevention   Complete by: As directed    Drink plenty of fluids.  Prune juice may be helpful.  You may use a stool softener, such as Colace (over the counter) 100 mg twice a day.  Use MiraLax (over the counter) for constipation as needed.   Diet - low sodium heart healthy   Complete by: As directed    Driving restrictions   Complete by: As directed    No driving for 2-4 weeks   Follow the hip precautions as taught in Physical Therapy   Complete by: As directed    Increase activity slowly as tolerated   Complete by: As directed    Post-operative opioid taper instructions:   Complete by: As directed    POST-OPERATIVE OPIOID TAPER INSTRUCTIONS: It is important to wean off of your opioid medication as soon as possible. If you do not need pain medication after your surgery it  is ok to stop day one. Opioids include: Codeine, Hydrocodone(Norco, Vicodin), Oxycodone(Percocet, oxycontin) and hydromorphone amongst others.  Long term and even short term use of opiods can cause: Increased pain response Dependence Constipation Depression Respiratory depression And more.  Withdrawal symptoms can include Flu like symptoms Nausea, vomiting And more Techniques to manage these symptoms Hydrate well Eat regular healthy meals Stay active Use relaxation techniques(deep breathing, meditating, yoga) Do Not substitute Alcohol to help with tapering If you have been on opioids for less than two weeks and do not have pain than it is ok to stop all together.  Plan to wean off of opioids This plan should start within one week post op of your joint replacement. Maintain the same interval or time between taking each dose and first decrease the dose.  Cut the total daily intake of opioids by one tablet each day Next start to increase the time between doses. The last dose that should be eliminated is the evening dose.      TED hose   Complete by: As directed    Use stockings (TED hose) for 2 weeks on both leg(s).  Then for 2 more weeks on the surgical leg.  You may remove them at night for sleeping.        Follow-up Information     Murleen Arms, MD. Go on 08/29/2023.   Specialty: Orthopedic Surgery Why: Your appointment is scheduled for 2:30 Contact information: [address] [place] [address] [phone]         [facility] Follow up.   Why: HHPT will provide 6 home visits prior to stating outpatient physical therapy        [facility] Go on 08/30/2023.   Why: your outpatient physical therapy is scheduled. The facility will call you with a time Contact information: [address] [place] [address] [phone]                    Discharge Instructions       INSTRUCTIONS AFTER JOINT REPLACEMENT  Remove items at home which could result in a fall. This includes throw rugs or furniture in walking pathways ICE to the affected joint every three hours while awake for 30 minutes at a time, for at least the first 3-5 days, and then as needed for pain and swelling.  Continue to use ice for pain and swelling. You may notice swelling that will progress down to the foot and ankle.  This is normal after surgery.  Elevate your leg when you are not up walking on it.   Continue to use the breathing machine you got in the hospital (incentive spirometer) which will help keep your temperature down.  It is common for your temperature to cycle up and down following surgery, especially at night when you are not up moving around and exerting yourself.  The breathing machine keeps your lungs expanded and your temperature down.  DIET:  As you were doing prior to hospitalization, we recommend a well-balanced diet.  DRESSING / WOUND CARE / SHOWERING:  Keep the surgical dressing until follow up.  The dressing is water  proof, so you can shower without any extra covering.  IF THE DRESSING FALLS OFF or the wound gets wet inside, change the dressing with sterile gauze.  Please use good hand washing techniques before changing the dressing.  Do not use any lotions or creams on the incision until instructed by your surgeon.    ACTIVITY  Increase activity slowly as tolerated, but follow the weight bearing instructions below.   No driving for 6 weeks or until further direction given by your physician.  You cannot drive while taking narcotics.  No lifting or carrying greater than 10 lbs. until further directed by your surgeon. Avoid periods of inactivity such as sitting longer than an hour when not asleep. This helps prevent blood clots.  You may return to work once you are authorized by your doctor.   WEIGHT BEARING: Weight bearing as tolerated with assist device (walker, cane,  etc) as directed, use it as long as suggested by your surgeon or therapist, typically at least 4-6 weeks.  EXERCISES  Results after joint replacement surgery are often greatly improved when you follow the exercise, range of motion and muscle strengthening exercises prescribed by your doctor. Safety measures are also important to protect the joint from further injury. Any time any of these exercises cause you to have increased pain or swelling, decrease what you are doing until you are comfortable again and then slowly increase them. If you have problems or questions, call your caregiver or physical therapist for advice.   Rehabilitation is important following a joint replacement. After just a few days of immobilization, the muscles of the leg can become weakened and shrink (atrophy).  These exercises are designed to build up the tone and strength of the thigh and leg muscles and to improve motion. Often times heat used for twenty to thirty minutes before working out will loosen up your tissues and help with improving the range of motion but do not use heat for the first two weeks following surgery (sometimes heat can increase post-operative swelling).   These exercises can be done on a training (exercise) mat, on the floor, on a table or on a bed. Use whatever works the best and is most comfortable for you.    Use music or television while you are exercising so that the exercises are a pleasant break in your day. This will make your life better with the exercises acting as  a break in your routine that you can look forward to.   Perform all exercises about fifteen times, three times per day or as directed.  You should exercise both the operative leg and the other leg as well.  Exercises include:   Quad Sets - Tighten up the muscle on the front of the thigh (Quad) and hold for 5-10 seconds.   Straight Leg Raises - With your knee straight (if you were given a brace, keep it on), lift the leg to 60 degrees,  hold for 3 seconds, and slowly lower the leg.  Perform this exercise against resistance later as your leg gets stronger.  Leg Slides: Lying on your back, slowly slide your foot toward your buttocks, bending your knee up off the floor (only go as far as is comfortable). Then slowly slide your foot back down until your leg is flat on the floor again.  Angel Wings: Lying on your back spread your legs to the side as far apart as you can without causing discomfort.  Hamstring Strength:  Lying on your back, push your heel against the floor with your leg straight by tightening up the muscles of your buttocks.  Repeat, but this time bend your knee to a comfortable angle, and push your heel against the floor.  You may put a pillow under the heel to make it more comfortable if necessary.   A rehabilitation program following joint replacement surgery can speed recovery and prevent re-injury in the future due to weakened muscles. Contact your doctor or a physical therapist for more information on knee rehabilitation.   CONSTIPATION:  Constipation is defined medically as fewer than three stools per week and severe constipation as less than one stool per week.  Even if you have a regular bowel pattern at home, your normal regimen is likely to be disrupted due to multiple reasons following surgery.  Combination of anesthesia, postoperative narcotics, change in appetite and fluid intake all can affect your bowels.   YOU MUST use at least one of the following options; they are listed in order of increasing strength to get the job done.  They are all available over the counter, and you may need to use some, POSSIBLY even all of these options:    Drink plenty of fluids (prune juice may be helpful) and high fiber foods Colace 100 mg by mouth twice a day  Senokot for constipation as directed and as needed Dulcolax (bisacodyl), take with full glass of water   Miralax (polyethylene glycol) once or twice a day as needed.  If  you have tried all these things and are unable to have a bowel movement in the first 3-4 days after surgery call either your surgeon or your primary doctor.    If you experience loose stools or diarrhea, hold the medications until you stool forms back up.  If your symptoms do not get better within 1 week or if they get worse, check with your doctor.  If you experience "the worst abdominal pain ever" or develop nausea or vomiting, please contact the office immediately for further recommendations for treatment.  ITCHING:  If you experience itching with your medications, try taking only a single pain pill, or even half a pain pill at a time.  You can also use Benadryl over the counter for itching or also to help with sleep.   TED HOSE STOCKINGS:  Use stockings on both legs until for at least 2 weeks or as directed by physician office. They  may be removed at night for sleeping.  MEDICATIONS:  See your medication summary on the "After Visit Summary" that nursing will review with you.  You may have some home medications which will be placed on hold until you complete the course of blood thinner medication.  It is important for you to complete the blood thinner medication as prescribed.  Blood clot prevention (DVT Prophylaxis): After surgery you are at an increased risk for a blood clot.  You were prescribed a blood thinner, Aspirin 81mg , to be taken twice daily for a total of 4 weeks from surgery to help reduce your risk of getting a blood clot.  Signs of a pulmonary embolus (blood clot in the lungs) include sudden short of breath, feeling lightheaded or dizzy, chest pain with a deep breath, rapid pulse rapid breathing.  Signs of a blood clot in your arms or legs include new unexplained swelling and cramping, warm, red or darkened skin around the painful area.  Please call the office or 911 right away if these signs or symptoms develop.  PRECAUTIONS:   If you experience chest pain or shortness of breath -  call 911 immediately for transfer to the hospital emergency department.   If you develop a fever greater that 101 F, purulent drainage from wound, increased redness or drainage from wound, foul odor from the wound/dressing, or calf pain - CONTACT YOUR SURGEON.                                                   FOLLOW-UP APPOINTMENTS:  If you do not already have a post-op appointment, please call the office for an appointment to be seen by your surgeon.  Guidelines for how soon to be seen are listed in your "After Visit Summary", but are typically between 2-3 weeks after surgery.  If you have a specialized bandage, you may be told to follow up 1 week after surgery.  POST-OPERATIVE OPIOID TAPER INSTRUCTIONS: It is important to wean off of your opioid medication as soon as possible. If you do not need pain medication after your surgery it is ok to stop day one. Opioids include: Codeine, Hydrocodone(Norco, Vicodin), Oxycodone(Percocet, oxycontin) and hydromorphone amongst others.  Long term and even short term use of opiods can cause: Increased pain response Dependence Constipation Depression Respiratory depression And more.  Withdrawal symptoms can include Flu like symptoms Nausea, vomiting And more Techniques to manage these symptoms Hydrate well Eat regular healthy meals Stay active Use relaxation techniques(deep breathing, meditating, yoga) Do Not substitute Alcohol to help with tapering If you have been on opioids for less than two weeks and do not have pain than it is ok to stop all together.  Plan to wean off of opioids This plan should start within one week post op of your joint replacement. Maintain the same interval or time between taking each dose and first decrease the dose.  Cut the total daily intake of opioids by one tablet each day Next start to increase the time between doses. The last dose that should be eliminated is the evening dose.   MAKE SURE YOU:  Understand  these instructions.  Get help right away if you are not doing well or get worse.    Thank you for letting us  be a part of your medical care team.  It is a privilege  we respect greatly.  We hope these instructions will help you stay on track for a fast and full recovery!           Signed: Halden Phegley A Eligha Kmetz 08/15/2023, 7:15 AM

## 2023-08-15 NOTE — Plan of Care (Signed)
 Problem: Education: Goal: Knowledge of General Education information will improve Description: Including pain rating scale, medication(s)/side effects and non-pharmacologic comfort measures 08/15/2023 1045 by Kerwin Peels, RN Outcome: Adequate for Discharge 08/15/2023 0820 by Kerwin Peels, RN Outcome: Progressing   Problem: Health Behavior/Discharge Planning: Goal: Ability to manage health-related needs will improve 08/15/2023 1045 by Kerwin Peels, RN Outcome: Adequate for Discharge 08/15/2023 0820 by Kerwin Peels, RN Outcome: Progressing   Problem: Clinical Measurements: Goal: Ability to maintain clinical measurements within normal limits will improve 08/15/2023 1045 by Kerwin Peels, RN Outcome: Adequate for Discharge 08/15/2023 0820 by Kerwin Peels, RN Outcome: Progressing Goal: Will remain free from infection 08/15/2023 1045 by Kerwin Peels, RN Outcome: Adequate for Discharge 08/15/2023 0820 by Kerwin Peels, RN Outcome: Progressing Goal: Diagnostic test results will improve 08/15/2023 1045 by Kerwin Peels, RN Outcome: Adequate for Discharge 08/15/2023 0820 by Kerwin Peels, RN Outcome: Progressing Goal: Respiratory complications will improve 08/15/2023 1045 by Kerwin Peels, RN Outcome: Adequate for Discharge 08/15/2023 0820 by Kerwin Peels, RN Outcome: Progressing Goal: Cardiovascular complication will be avoided Outcome: Adequate for Discharge   Problem: Activity: Goal: Risk for activity intolerance will decrease Outcome: Adequate for Discharge   Problem: Nutrition: Goal: Adequate nutrition will be maintained Outcome: Adequate for Discharge   Problem: Coping: Goal: Level of anxiety will decrease Outcome: Adequate for Discharge   Problem: Elimination: Goal: Will not experience complications related to bowel motility Outcome: Adequate for Discharge Goal: Will not experience  complications related to urinary retention Outcome: Adequate for Discharge   Problem: Pain Managment: Goal: General experience of comfort will improve and/or be controlled Outcome: Adequate for Discharge   Problem: Safety: Goal: Ability to remain free from injury will improve Outcome: Adequate for Discharge   Problem: Skin Integrity: Goal: Risk for impaired skin integrity will decrease Outcome: Adequate for Discharge   Problem: Education: Goal: Knowledge of the prescribed therapeutic regimen will improve Outcome: Adequate for Discharge Goal: Understanding of discharge needs will improve Outcome: Adequate for Discharge Goal: Individualized Educational Video(s) Outcome: Adequate for Discharge   Problem: Activity: Goal: Ability to avoid complications of mobility impairment will improve Outcome: Adequate for Discharge Goal: Ability to tolerate increased activity will improve Outcome: Adequate for Discharge   Problem: Clinical Measurements: Goal: Postoperative complications will be avoided or minimized Outcome: Adequate for Discharge   Problem: Pain Management: Goal: Pain level will decrease with appropriate interventions Outcome: Adequate for Discharge   Problem: Skin Integrity: Goal: Will show signs of wound healing Outcome: Adequate for Discharge   Problem: Education: Goal: Ability to describe self-care measures that may prevent or decrease complications (Diabetes Survival Skills Education) will improve Outcome: Adequate for Discharge Goal: Individualized Educational Video(s) Outcome: Adequate for Discharge   Problem: Coping: Goal: Ability to adjust to condition or change in health will improve Outcome: Adequate for Discharge   Problem: Fluid Volume: Goal: Ability to maintain a balanced intake and output will improve Outcome: Adequate for Discharge   Problem: Health Behavior/Discharge Planning: Goal: Ability to identify and utilize available resources and  services will improve Outcome: Adequate for Discharge Goal: Ability to manage health-related needs will improve Outcome: Adequate for Discharge   Problem: Metabolic: Goal: Ability to maintain appropriate glucose levels will improve Outcome: Adequate for Discharge   Problem: Nutritional: Goal: Maintenance of adequate nutrition will improve Outcome: Adequate for Discharge Goal: Progress toward achieving an optimal weight will improve Outcome: Adequate for Discharge   Problem: Skin Integrity:  Goal: Risk for impaired skin integrity will decrease Outcome: Adequate for Discharge   Problem: Tissue Perfusion: Goal: Adequacy of tissue perfusion will improve Outcome: Adequate for Discharge

## 2023-08-15 NOTE — Progress Notes (Signed)
 Physical Therapy Treatment Patient Details Name: Terri Miranda MRN: [id number] DOB: 1945/04/11 Today's Date: 08/15/2023   History of Present Illness 78 y.o. female admitted 08/14/23 for L THA, posterior approach, no formal precautions. PMH: colon cancer, UTI, DM, R TKA.    PT Comments  Pt is progressing well with mobility, she ambulated 69' with RW, no loss of balance. Stair training completed with spouse present. Pt demonstrates understanding of HEP. She is ready to DC home from a PT standpoint.     If plan is discharge home, recommend the following: A little help with walking and/or transfers;A little help with bathing/dressing/bathroom;Assistance with cooking/housework;Assist for transportation;Help with stairs or ramp for entrance   Can travel by private vehicle        Equipment Recommendations  None recommended by PT    Recommendations for Other Services       Precautions / Restrictions Precautions Precautions: Fall Recall of Precautions/Restrictions: Intact Restrictions Weight Bearing Restrictions Per Provider Order: Yes LLE Weight Bearing Per Provider Order: Weight bearing as tolerated     Mobility  Bed Mobility               General bed mobility comments: up in recliner    Transfers Overall transfer level: Needs assistance Equipment used: Rolling walker (2 wheels) Transfers: Sit to/from Stand Sit to Stand: Contact guard assist, From elevated surface           General transfer comment: VCs hand placement    Ambulation/Gait Ambulation/Gait assistance: Contact guard assist Gait Distance (Feet): 80 Feet Assistive device: Rolling walker (2 wheels) Gait Pattern/deviations: Step-to pattern, Decreased step length - right, Decreased step length - left Gait velocity: decr     General Gait Details: steady, no loss of balance   Stairs Stairs: Yes Stairs assistance: Contact guard assist Stair Management: One rail Right, Step to pattern, Forwards Number  of Stairs: 3 General stair comments: VCs sequencing, spouse present and assisted with management of RW   Wheelchair Mobility     Tilt Bed    Modified Rankin (Stroke Patients Only)       Balance Overall balance assessment: Modified Independent                                          Communication Communication Communication: No apparent difficulties  Cognition Arousal: Alert Behavior During Therapy: WFL for tasks assessed/performed   PT - Cognitive impairments: No apparent impairments                         Following commands: Intact      Cueing    Exercises Total Joint Exercises Ankle Circles/Pumps: AROM, Both, 10 reps, Supine Quad Sets: AROM, Both, 5 reps, Supine Short Arc Quad: AROM, Left, 5 reps, Supine Heel Slides: AAROM, Left, 5 reps, Supine Hip ABduction/ADduction: AAROM, Left, 5 reps, Supine Long Arc Quad: AROM, Left, 10 reps, Seated    General Comments        Pertinent Vitals/Pain Pain Assessment Pain Score: 6  Pain Location: L hip Pain Descriptors / Indicators: Sore Pain Intervention(s): Limited activity within patient's tolerance, Monitored during session, Premedicated before session, Ice applied    Home Living                          Prior Function  PT Goals (current goals can now be found in the care plan section) Acute Rehab PT Goals Patient Stated Goal: to walk better PT Goal Formulation: With patient Time For Goal Achievement: 08/21/23 Potential to Achieve Goals: Good Progress towards PT goals: Progressing toward goals    Frequency    7X/week      PT Plan      Co-evaluation              AM-PAC PT "6 Clicks" Mobility   Outcome Measure  Help needed turning from your back to your side while in a flat bed without using bedrails?: None Help needed moving from lying on your back to sitting on the side of a flat bed without using bedrails?: A Little Help needed moving to  and from a bed to a chair (including a wheelchair)?: None Help needed standing up from a chair using your arms (e.g., wheelchair or bedside chair)?: None Help needed to walk in hospital room?: None Help needed climbing 3-5 steps with a railing? : A Little 6 Click Score: 22    End of Session Equipment Utilized During Treatment: Gait belt Activity Tolerance: Patient tolerated treatment well Patient left: in chair;with chair alarm set;with call bell/phone within reach;with family/visitor present Nurse Communication: Mobility status PT Visit Diagnosis: Difficulty in walking, not elsewhere classified (R26.2);Pain Pain - Right/Left: Left Pain - part of body: Hip     Time: 4098-1191 PT Time Calculation (min) (ACUTE ONLY): 28 min  Charges:    $Gait Training: 8-22 mins $Therapeutic Exercise: 8-22 mins PT General Charges $$ ACUTE PT VISIT: 1 Visit                     Daymon Evans PT 08/15/2023  Acute Rehabilitation Services  Office [phone]

## 2023-08-31 DIAGNOSIS — M6281 Muscle weakness (generalized): Secondary | ICD-10-CM | POA: Diagnosis not present

## 2023-08-31 DIAGNOSIS — M25652 Stiffness of left hip, not elsewhere classified: Secondary | ICD-10-CM | POA: Diagnosis not present

## 2023-08-31 DIAGNOSIS — Z4789 Encounter for other orthopedic aftercare: Secondary | ICD-10-CM | POA: Diagnosis not present

## 2023-08-31 DIAGNOSIS — M25552 Pain in left hip: Secondary | ICD-10-CM | POA: Diagnosis not present

## 2023-08-31 DIAGNOSIS — R2689 Other abnormalities of gait and mobility: Secondary | ICD-10-CM | POA: Diagnosis not present

## 2023-09-06 DIAGNOSIS — Z4789 Encounter for other orthopedic aftercare: Secondary | ICD-10-CM | POA: Diagnosis not present

## 2023-09-06 DIAGNOSIS — M25652 Stiffness of left hip, not elsewhere classified: Secondary | ICD-10-CM | POA: Diagnosis not present

## 2023-09-06 DIAGNOSIS — M6281 Muscle weakness (generalized): Secondary | ICD-10-CM | POA: Diagnosis not present

## 2023-09-06 DIAGNOSIS — M25552 Pain in left hip: Secondary | ICD-10-CM | POA: Diagnosis not present

## 2023-09-06 DIAGNOSIS — R2689 Other abnormalities of gait and mobility: Secondary | ICD-10-CM | POA: Diagnosis not present

## 2023-09-08 DIAGNOSIS — M25552 Pain in left hip: Secondary | ICD-10-CM | POA: Diagnosis not present

## 2023-09-08 DIAGNOSIS — R2689 Other abnormalities of gait and mobility: Secondary | ICD-10-CM | POA: Diagnosis not present

## 2023-09-08 DIAGNOSIS — M25652 Stiffness of left hip, not elsewhere classified: Secondary | ICD-10-CM | POA: Diagnosis not present

## 2023-09-08 DIAGNOSIS — M6281 Muscle weakness (generalized): Secondary | ICD-10-CM | POA: Diagnosis not present

## 2023-09-08 DIAGNOSIS — Z4789 Encounter for other orthopedic aftercare: Secondary | ICD-10-CM | POA: Diagnosis not present

## 2023-09-11 DIAGNOSIS — M1990 Unspecified osteoarthritis, unspecified site: Secondary | ICD-10-CM | POA: Diagnosis not present

## 2023-09-11 DIAGNOSIS — N1831 Chronic kidney disease, stage 3a: Secondary | ICD-10-CM | POA: Diagnosis not present

## 2023-09-11 DIAGNOSIS — N183 Chronic kidney disease, stage 3 unspecified: Secondary | ICD-10-CM | POA: Diagnosis not present

## 2023-09-11 DIAGNOSIS — E1169 Type 2 diabetes mellitus with other specified complication: Secondary | ICD-10-CM | POA: Diagnosis not present

## 2023-09-12 DIAGNOSIS — M25652 Stiffness of left hip, not elsewhere classified: Secondary | ICD-10-CM | POA: Diagnosis not present

## 2023-09-12 DIAGNOSIS — M6281 Muscle weakness (generalized): Secondary | ICD-10-CM | POA: Diagnosis not present

## 2023-09-12 DIAGNOSIS — Z4789 Encounter for other orthopedic aftercare: Secondary | ICD-10-CM | POA: Diagnosis not present

## 2023-09-12 DIAGNOSIS — R2689 Other abnormalities of gait and mobility: Secondary | ICD-10-CM | POA: Diagnosis not present

## 2023-09-12 DIAGNOSIS — M25552 Pain in left hip: Secondary | ICD-10-CM | POA: Diagnosis not present

## 2023-09-14 DIAGNOSIS — Z4789 Encounter for other orthopedic aftercare: Secondary | ICD-10-CM | POA: Diagnosis not present

## 2023-09-14 DIAGNOSIS — M25652 Stiffness of left hip, not elsewhere classified: Secondary | ICD-10-CM | POA: Diagnosis not present

## 2023-09-14 DIAGNOSIS — R2689 Other abnormalities of gait and mobility: Secondary | ICD-10-CM | POA: Diagnosis not present

## 2023-09-14 DIAGNOSIS — M6281 Muscle weakness (generalized): Secondary | ICD-10-CM | POA: Diagnosis not present

## 2023-09-14 DIAGNOSIS — M25552 Pain in left hip: Secondary | ICD-10-CM | POA: Diagnosis not present

## 2023-09-19 DIAGNOSIS — M25552 Pain in left hip: Secondary | ICD-10-CM | POA: Diagnosis not present

## 2023-09-19 DIAGNOSIS — Z4789 Encounter for other orthopedic aftercare: Secondary | ICD-10-CM | POA: Diagnosis not present

## 2023-09-19 DIAGNOSIS — M6281 Muscle weakness (generalized): Secondary | ICD-10-CM | POA: Diagnosis not present

## 2023-09-19 DIAGNOSIS — M25652 Stiffness of left hip, not elsewhere classified: Secondary | ICD-10-CM | POA: Diagnosis not present

## 2023-09-19 DIAGNOSIS — R2689 Other abnormalities of gait and mobility: Secondary | ICD-10-CM | POA: Diagnosis not present

## 2023-09-22 DIAGNOSIS — M6281 Muscle weakness (generalized): Secondary | ICD-10-CM | POA: Diagnosis not present

## 2023-09-22 DIAGNOSIS — R2689 Other abnormalities of gait and mobility: Secondary | ICD-10-CM | POA: Diagnosis not present

## 2023-09-22 DIAGNOSIS — Z4789 Encounter for other orthopedic aftercare: Secondary | ICD-10-CM | POA: Diagnosis not present

## 2023-09-22 DIAGNOSIS — M25552 Pain in left hip: Secondary | ICD-10-CM | POA: Diagnosis not present

## 2023-09-22 DIAGNOSIS — M25652 Stiffness of left hip, not elsewhere classified: Secondary | ICD-10-CM | POA: Diagnosis not present

## 2023-09-26 DIAGNOSIS — M25652 Stiffness of left hip, not elsewhere classified: Secondary | ICD-10-CM | POA: Diagnosis not present

## 2023-09-26 DIAGNOSIS — M6281 Muscle weakness (generalized): Secondary | ICD-10-CM | POA: Diagnosis not present

## 2023-09-26 DIAGNOSIS — Z4789 Encounter for other orthopedic aftercare: Secondary | ICD-10-CM | POA: Diagnosis not present

## 2023-09-26 DIAGNOSIS — M25552 Pain in left hip: Secondary | ICD-10-CM | POA: Diagnosis not present

## 2023-09-26 DIAGNOSIS — R2689 Other abnormalities of gait and mobility: Secondary | ICD-10-CM | POA: Diagnosis not present

## 2023-10-02 DIAGNOSIS — R2689 Other abnormalities of gait and mobility: Secondary | ICD-10-CM | POA: Diagnosis not present

## 2023-10-02 DIAGNOSIS — M25552 Pain in left hip: Secondary | ICD-10-CM | POA: Diagnosis not present

## 2023-10-02 DIAGNOSIS — M6281 Muscle weakness (generalized): Secondary | ICD-10-CM | POA: Diagnosis not present

## 2023-10-02 DIAGNOSIS — Z4789 Encounter for other orthopedic aftercare: Secondary | ICD-10-CM | POA: Diagnosis not present

## 2023-10-02 DIAGNOSIS — M25652 Stiffness of left hip, not elsewhere classified: Secondary | ICD-10-CM | POA: Diagnosis not present

## 2023-10-04 DIAGNOSIS — R2689 Other abnormalities of gait and mobility: Secondary | ICD-10-CM | POA: Diagnosis not present

## 2023-10-04 DIAGNOSIS — M6281 Muscle weakness (generalized): Secondary | ICD-10-CM | POA: Diagnosis not present

## 2023-10-04 DIAGNOSIS — M25552 Pain in left hip: Secondary | ICD-10-CM | POA: Diagnosis not present

## 2023-10-04 DIAGNOSIS — Z4789 Encounter for other orthopedic aftercare: Secondary | ICD-10-CM | POA: Diagnosis not present

## 2023-10-04 DIAGNOSIS — M25652 Stiffness of left hip, not elsewhere classified: Secondary | ICD-10-CM | POA: Diagnosis not present

## 2023-10-10 DIAGNOSIS — M6281 Muscle weakness (generalized): Secondary | ICD-10-CM | POA: Diagnosis not present

## 2023-10-10 DIAGNOSIS — M25552 Pain in left hip: Secondary | ICD-10-CM | POA: Diagnosis not present

## 2023-10-10 DIAGNOSIS — R2689 Other abnormalities of gait and mobility: Secondary | ICD-10-CM | POA: Diagnosis not present

## 2023-10-10 DIAGNOSIS — M25652 Stiffness of left hip, not elsewhere classified: Secondary | ICD-10-CM | POA: Diagnosis not present

## 2023-10-10 DIAGNOSIS — Z4789 Encounter for other orthopedic aftercare: Secondary | ICD-10-CM | POA: Diagnosis not present

## 2023-10-12 DIAGNOSIS — E1169 Type 2 diabetes mellitus with other specified complication: Secondary | ICD-10-CM | POA: Diagnosis not present

## 2023-10-12 DIAGNOSIS — M1990 Unspecified osteoarthritis, unspecified site: Secondary | ICD-10-CM | POA: Diagnosis not present

## 2023-10-12 DIAGNOSIS — N183 Chronic kidney disease, stage 3 unspecified: Secondary | ICD-10-CM | POA: Diagnosis not present

## 2023-10-12 DIAGNOSIS — N1831 Chronic kidney disease, stage 3a: Secondary | ICD-10-CM | POA: Diagnosis not present

## 2023-11-12 DIAGNOSIS — M1990 Unspecified osteoarthritis, unspecified site: Secondary | ICD-10-CM | POA: Diagnosis not present

## 2023-11-12 DIAGNOSIS — E1169 Type 2 diabetes mellitus with other specified complication: Secondary | ICD-10-CM | POA: Diagnosis not present

## 2023-11-12 DIAGNOSIS — N1831 Chronic kidney disease, stage 3a: Secondary | ICD-10-CM | POA: Diagnosis not present

## 2023-11-12 DIAGNOSIS — N183 Chronic kidney disease, stage 3 unspecified: Secondary | ICD-10-CM | POA: Diagnosis not present

## 2023-12-12 DIAGNOSIS — E1169 Type 2 diabetes mellitus with other specified complication: Secondary | ICD-10-CM | POA: Diagnosis not present

## 2023-12-12 DIAGNOSIS — N183 Chronic kidney disease, stage 3 unspecified: Secondary | ICD-10-CM | POA: Diagnosis not present

## 2023-12-12 DIAGNOSIS — M1990 Unspecified osteoarthritis, unspecified site: Secondary | ICD-10-CM | POA: Diagnosis not present

## 2023-12-12 DIAGNOSIS — N1831 Chronic kidney disease, stage 3a: Secondary | ICD-10-CM | POA: Diagnosis not present

## 2023-12-27 ENCOUNTER — Encounter (INDEPENDENT_AMBULATORY_CARE_PROVIDER_SITE_OTHER): Payer: Self-pay | Admitting: Gastroenterology

## 2024-01-01 ENCOUNTER — Other Ambulatory Visit: Payer: Self-pay | Admitting: Internal Medicine

## 2024-01-01 DIAGNOSIS — Z1231 Encounter for screening mammogram for malignant neoplasm of breast: Secondary | ICD-10-CM

## 2024-01-12 DIAGNOSIS — M1990 Unspecified osteoarthritis, unspecified site: Secondary | ICD-10-CM | POA: Diagnosis not present

## 2024-01-12 DIAGNOSIS — N1831 Chronic kidney disease, stage 3a: Secondary | ICD-10-CM | POA: Diagnosis not present

## 2024-01-12 DIAGNOSIS — N183 Chronic kidney disease, stage 3 unspecified: Secondary | ICD-10-CM | POA: Diagnosis not present

## 2024-01-12 DIAGNOSIS — E1169 Type 2 diabetes mellitus with other specified complication: Secondary | ICD-10-CM | POA: Diagnosis not present

## 2024-01-29 ENCOUNTER — Ambulatory Visit
Admission: RE | Admit: 2024-01-29 | Discharge: 2024-01-29 | Disposition: A | Discharge status: Home / Self Care | Source: Ambulatory Visit | Attending: Internal Medicine | Admitting: Internal Medicine

## 2024-01-29 DIAGNOSIS — Z1231 Encounter for screening mammogram for malignant neoplasm of breast: Secondary | ICD-10-CM

## 2024-01-30 DIAGNOSIS — I5033 Acute on chronic diastolic (congestive) heart failure: Secondary | ICD-10-CM | POA: Diagnosis not present

## 2024-01-30 DIAGNOSIS — Z0000 Encounter for general adult medical examination without abnormal findings: Secondary | ICD-10-CM | POA: Diagnosis not present

## 2024-01-30 DIAGNOSIS — Z1331 Encounter for screening for depression: Secondary | ICD-10-CM | POA: Diagnosis not present

## 2024-03-20 ENCOUNTER — Ambulatory Visit: Admitting: Cardiology

## 2024-06-12 ENCOUNTER — Ambulatory Visit: Admitting: Cardiology
